# Patient Record
Sex: Female | Born: 1980 | Race: White | Hispanic: No | Marital: Single | Smoking: Never smoker
Health system: Southern US, Community
[De-identification: ages and names within clinical notes are randomized; demographics above are authoritative.]

## PROBLEM LIST (undated history)

## (undated) DIAGNOSIS — F909 Attention-deficit hyperactivity disorder, unspecified type: Secondary | ICD-10-CM

## (undated) DIAGNOSIS — N3010 Interstitial cystitis (chronic) without hematuria: Secondary | ICD-10-CM

## (undated) DIAGNOSIS — N390 Urinary tract infection, site not specified: Secondary | ICD-10-CM

## (undated) DIAGNOSIS — E282 Polycystic ovarian syndrome: Secondary | ICD-10-CM

## (undated) DIAGNOSIS — E785 Hyperlipidemia, unspecified: Secondary | ICD-10-CM

## (undated) DIAGNOSIS — F419 Anxiety disorder, unspecified: Secondary | ICD-10-CM

## (undated) HISTORY — DX: Interstitial cystitis (chronic) without hematuria: N30.10

## (undated) HISTORY — PX: WISDOM TOOTH EXTRACTION: SHX21

## (undated) HISTORY — DX: Hyperlipidemia, unspecified: E78.5

## (undated) HISTORY — DX: Polycystic ovarian syndrome: E28.2

## (undated) HISTORY — DX: Urinary tract infection, site not specified: N39.0

## (undated) HISTORY — DX: Anxiety disorder, unspecified: F41.9

## (undated) HISTORY — DX: Attention-deficit hyperactivity disorder, unspecified type: F90.9

---
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
## 2002-05-26 ENCOUNTER — Other Ambulatory Visit: Admission: RE | Admit: 2002-05-26 | Discharge: 2002-05-26 | Payer: Self-pay | Admitting: Obstetrics and Gynecology

## 2003-07-18 ENCOUNTER — Other Ambulatory Visit: Admission: RE | Admit: 2003-07-18 | Discharge: 2003-07-18 | Payer: Self-pay | Admitting: Obstetrics and Gynecology

## 2004-06-05 ENCOUNTER — Ambulatory Visit: Payer: Self-pay | Admitting: Family Medicine

## 2004-08-02 ENCOUNTER — Ambulatory Visit: Payer: Self-pay | Admitting: Internal Medicine

## 2004-08-04 ENCOUNTER — Emergency Department (HOSPITAL_COMMUNITY): Admission: EM | Admit: 2004-08-04 | Discharge: 2004-08-05 | Payer: Self-pay | Admitting: Emergency Medicine

## 2004-08-06 ENCOUNTER — Other Ambulatory Visit: Admission: RE | Admit: 2004-08-06 | Discharge: 2004-08-06 | Payer: Self-pay | Admitting: Obstetrics and Gynecology

## 2004-08-29 ENCOUNTER — Ambulatory Visit: Payer: Self-pay | Admitting: Pediatrics

## 2005-07-03 ENCOUNTER — Ambulatory Visit: Payer: Self-pay | Admitting: Family Medicine

## 2005-07-18 ENCOUNTER — Ambulatory Visit: Payer: Self-pay | Admitting: Family Medicine

## 2005-07-21 ENCOUNTER — Encounter: Admission: RE | Admit: 2005-07-21 | Discharge: 2005-07-21 | Payer: Self-pay | Admitting: Family Medicine

## 2005-07-22 ENCOUNTER — Ambulatory Visit: Payer: Self-pay | Admitting: Family Medicine

## 2005-08-06 ENCOUNTER — Ambulatory Visit: Payer: Self-pay | Admitting: Family Medicine

## 2005-08-07 ENCOUNTER — Other Ambulatory Visit: Admission: RE | Admit: 2005-08-07 | Discharge: 2005-08-07 | Payer: Self-pay | Admitting: Obstetrics & Gynecology

## 2006-06-11 ENCOUNTER — Ambulatory Visit: Payer: Self-pay | Admitting: Family Medicine

## 2006-07-17 IMAGING — US US RENAL
1 series · 14 of 25 positions shown · non-contrast
Comparison: none

CLINICAL DATA: Two episodes of pyelonephritis.  Persistent right sided pain. 
 RENAL ULTRASOUND:
TECHNIQUE: Complete ultrasound examination of the urinary tract was performed including evaluation of the kidneys, renal collecting systems, and urinary bladder.

[Series 1: unknown · 0.27mm/px · 14 of 36 slices shown]
[im 1/36]
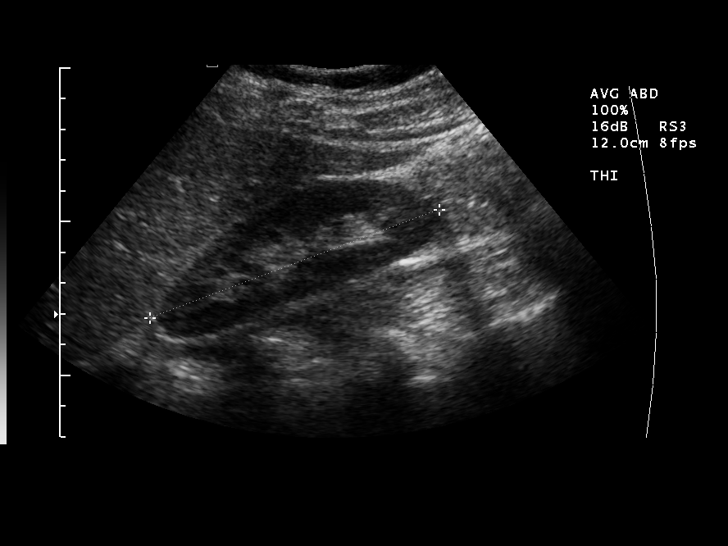
[im 3/36]
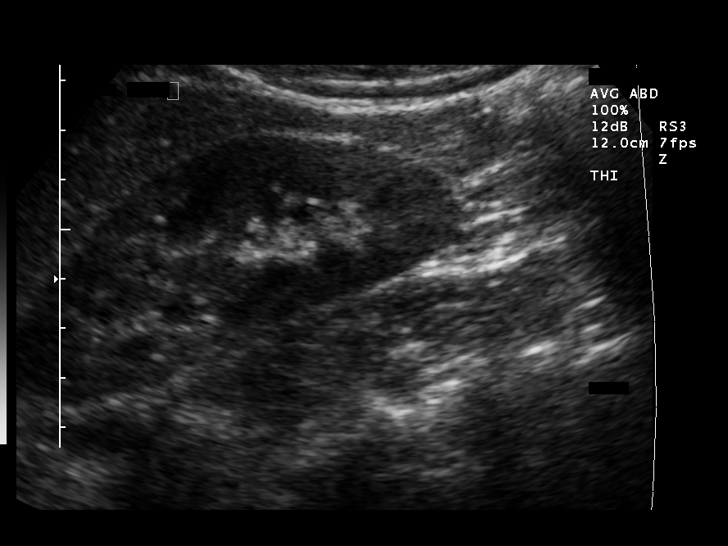
[im 6/36]
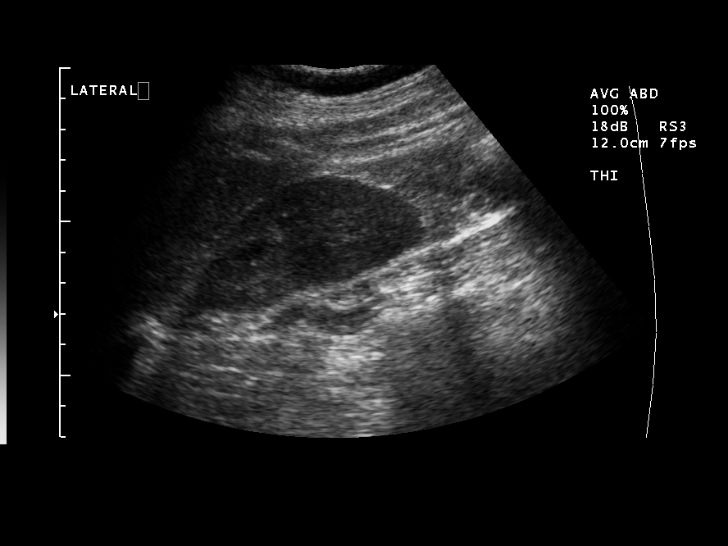
[im 9/36]
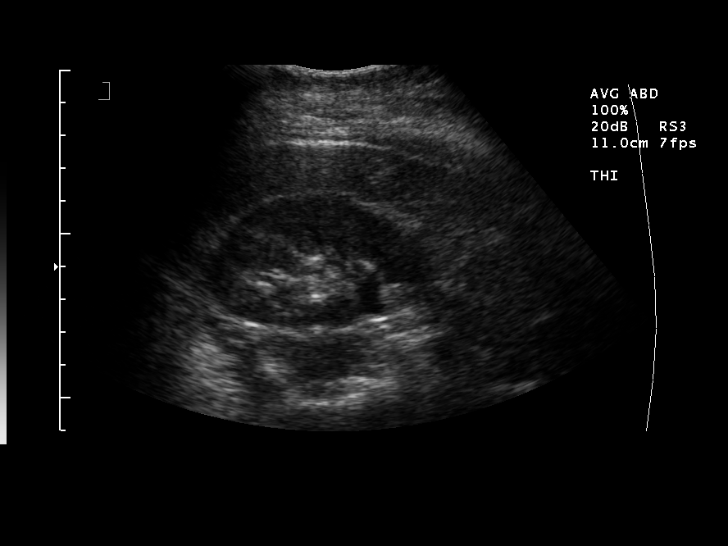
[im 12/36]
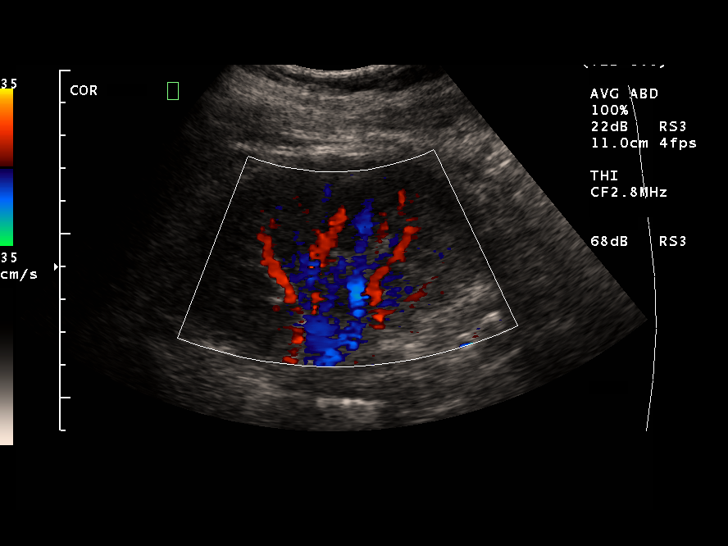
[im 14/36]
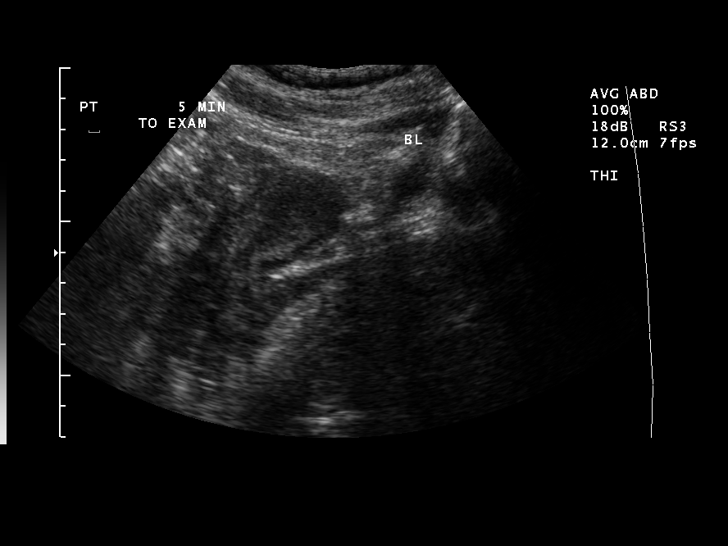
[im 17/36]
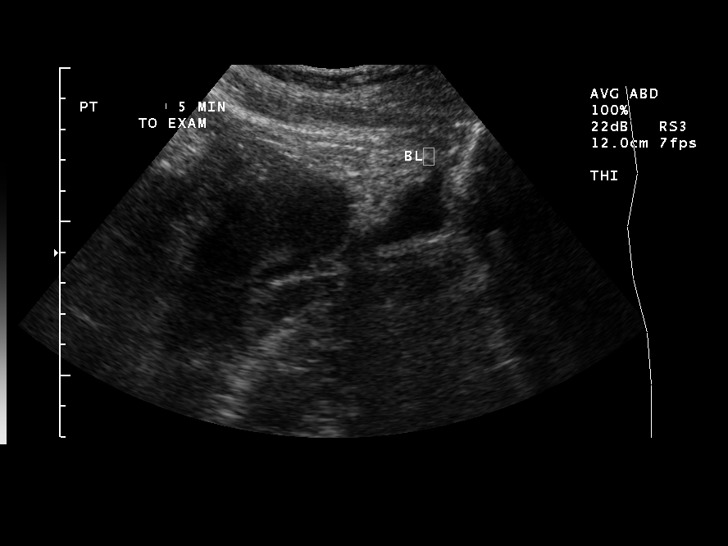
[im 19/36]
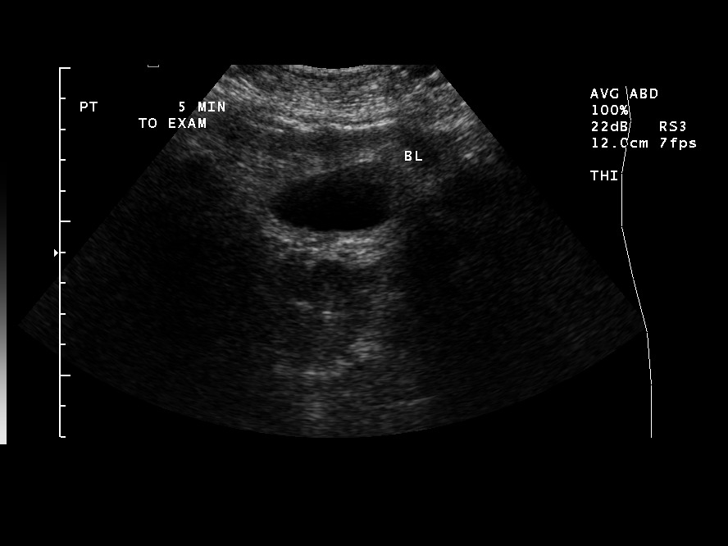
[im 22/36]
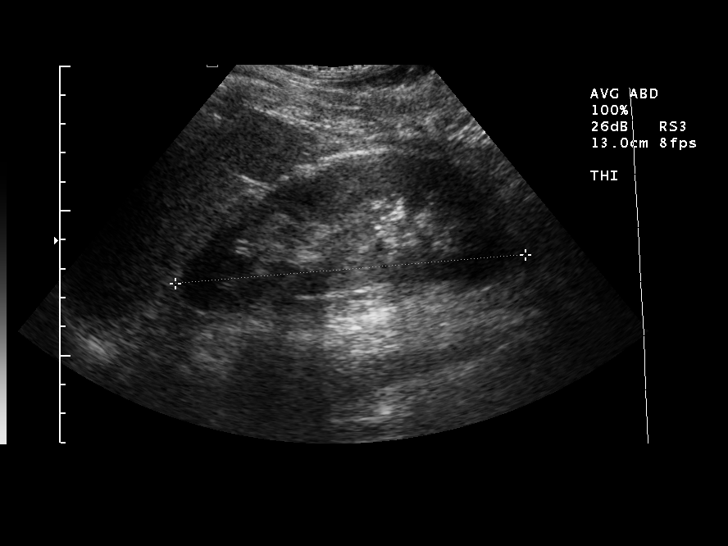
[im 24/36]
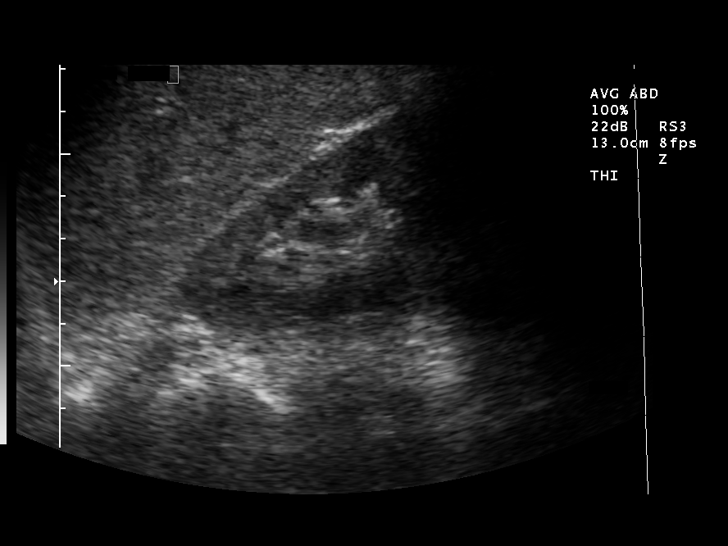
[im 27/36]
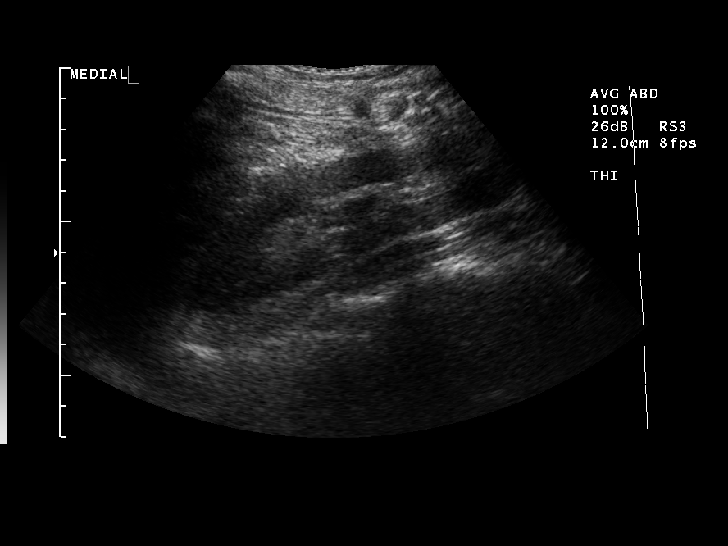
[im 30/36]
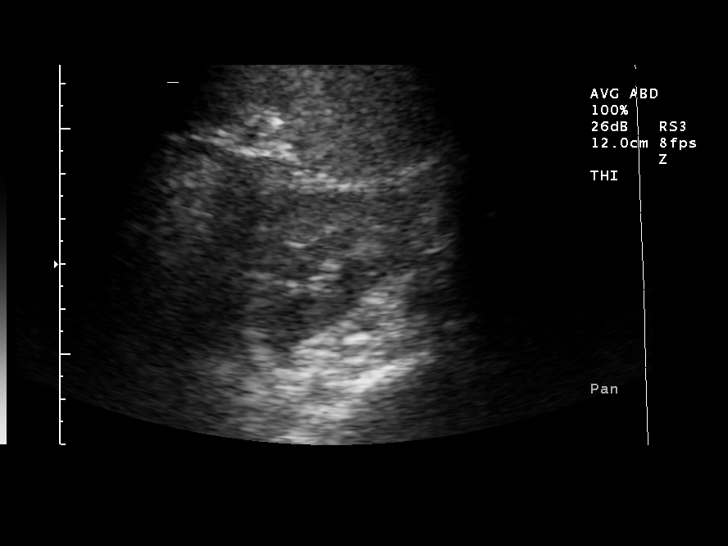
[im 33/36]
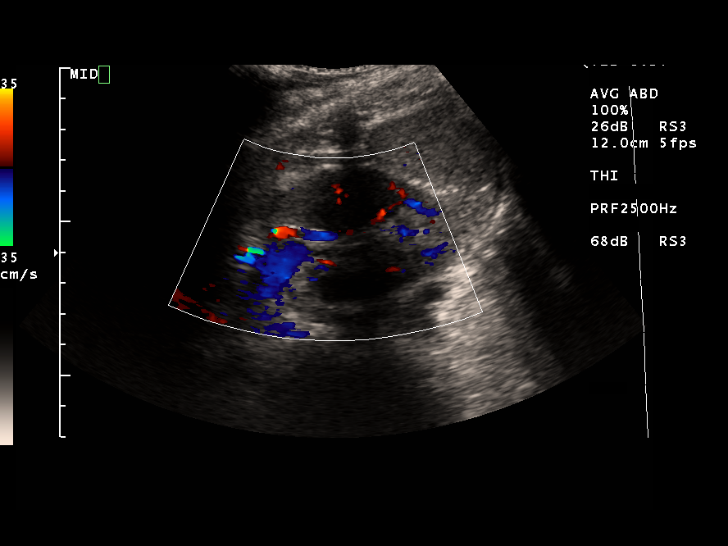
[im 36/36]
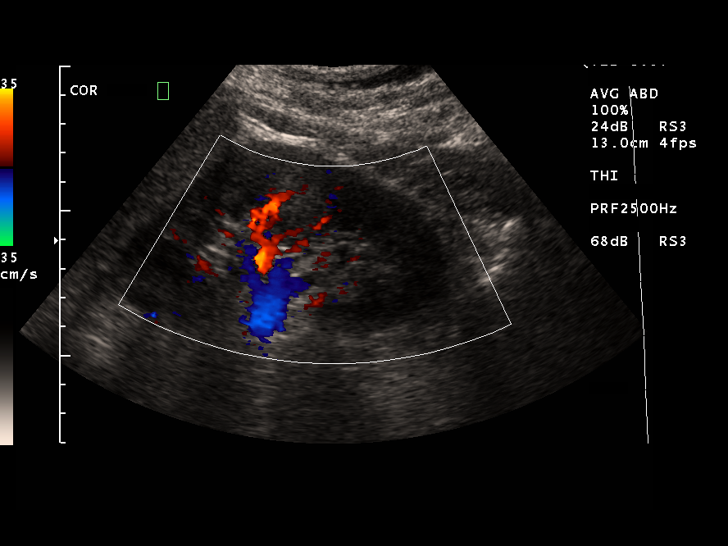

[14 of 25 positions shown; findings below may reference images not displayed]

FINDINGS: The right kidney measures 10.0 cm in length and has a normal contour and no hydronephrosis.  The left kidney measures 12.1 cm in length and also has a normal contour without hydronephrosis. The bladder is nondistended and cannot be visualized.
IMPRESSION: Normal bilateral renal ultrasound.

## 2006-07-29 DIAGNOSIS — F909 Attention-deficit hyperactivity disorder, unspecified type: Secondary | ICD-10-CM | POA: Insufficient documentation

## 2006-07-29 DIAGNOSIS — F902 Attention-deficit hyperactivity disorder, combined type: Secondary | ICD-10-CM | POA: Insufficient documentation

## 2006-08-03 ENCOUNTER — Ambulatory Visit: Payer: Self-pay | Admitting: Family Medicine

## 2006-08-12 ENCOUNTER — Ambulatory Visit: Payer: Self-pay | Admitting: Family Medicine

## 2006-08-12 DIAGNOSIS — N390 Urinary tract infection, site not specified: Secondary | ICD-10-CM | POA: Insufficient documentation

## 2006-08-12 LAB — CONVERTED CEMR LAB
Bilirubin Urine: NEGATIVE
Glucose, Urine, Semiquant: NEGATIVE
Ketones, urine, test strip: NEGATIVE
Nitrite: NEGATIVE
Protein, U semiquant: 30
Urobilinogen, UA: 0.2

## 2006-08-13 ENCOUNTER — Other Ambulatory Visit: Admission: RE | Admit: 2006-08-13 | Discharge: 2006-08-13 | Payer: Self-pay | Admitting: Obstetrics and Gynecology

## 2006-08-26 ENCOUNTER — Ambulatory Visit: Payer: Self-pay | Admitting: Family Medicine

## 2006-08-26 LAB — CONVERTED CEMR LAB
Blood in Urine, dipstick: NEGATIVE
Urobilinogen, UA: 0.2
pH: 6

## 2006-08-27 ENCOUNTER — Encounter: Payer: Self-pay | Admitting: Family Medicine

## 2006-09-01 ENCOUNTER — Encounter: Payer: Self-pay | Admitting: Family Medicine

## 2007-08-24 ENCOUNTER — Other Ambulatory Visit: Admission: RE | Admit: 2007-08-24 | Discharge: 2007-08-24 | Payer: Self-pay | Admitting: Obstetrics and Gynecology

## 2009-02-07 ENCOUNTER — Ambulatory Visit: Payer: Self-pay | Admitting: Family Medicine

## 2009-02-08 ENCOUNTER — Encounter: Payer: Self-pay | Admitting: Family Medicine

## 2009-10-20 ENCOUNTER — Encounter: Payer: Self-pay | Admitting: Family Medicine

## 2009-10-23 ENCOUNTER — Encounter: Payer: Self-pay | Admitting: Family Medicine

## 2010-02-04 ENCOUNTER — Ambulatory Visit
Admission: RE | Admit: 2010-02-04 | Discharge: 2010-02-04 | Payer: Self-pay | Source: Home / Self Care | Attending: Family Medicine | Admitting: Family Medicine

## 2010-02-06 ENCOUNTER — Telehealth: Payer: Self-pay | Admitting: Family Medicine

## 2010-02-12 NOTE — Letter (Signed)
[facility]   Imported By: Maryln Gottron 02/08/2009 15:29:24  _____________________________________________________________________  External Attachment:    Type:   Image     Comment:   External Document

## 2010-02-12 NOTE — Miscellaneous (Signed)
Summary: Flu Vaccine 2011  Clinical Lists Changes  Observations: Added new observation of FLU VAX: Fluvax 3+ (10/20/2009 7:49)      Influenza Immunization History:    Influenza # 1:  Fluvax 3+ (10/20/2009)  Pt was given flu shot at [facility] Lot # [id number] exp 07/13/10  Trixie Dredge  October 23, 2009 7:52 AM

## 2010-02-12 NOTE — Assessment & Plan Note (Signed)
Summary: RITALIN/ADHD/PS   Vital Signs:  Patient profile:   30 year old female Weight:      159 pounds Temp:     98.1 degrees F oral Pulse rate:   102 / minute BP sitting:   112 / 76  (left arm) Cuff size:   regular  Vitals Entered By: Alfred Levins, CMA (February 07, 2009 9:00 AM) CC: get back on ADHD med   History of Present Illness: Here to asks about getting back on ADHD meds. She had been taking either Ritalin LA or Metadate CD for years while in school. Then for the past 2 years she has been off threse meds. She graduated from [employer] and is now working as a Haematologist. The demands of her job have caused some problems, and she would like to get back on meds. Otherwise she feels fine and has had no other medical problems. No UTIs for almost 2 years now.   Preventive Screening-Counseling & Management  Alcohol-Tobacco     Smoking Status: never  Current Medications (verified): 1)  None  Allergies (verified): No Known Drug Allergies  Past History:  Past Medical History: sees Judie Bonus NP for GYN exams ADHD frequent UTIs  Past Surgical History: Reviewed history from 07/29/2006 and no changes required. WISDOM TEETH TUBES IN PLACED  Family History: Reviewed history and no changes required. ADHD in multiple family members kidney cancer Family History Hypertension Family History of Prostate CA 1st degree relative <50  Social History: Reviewed history and no changes required. Single Never Smoked Alcohol use-no Smoking Status:  never  Review of Systems  The patient denies anorexia, fever, weight loss, weight gain, vision loss, decreased hearing, hoarseness, chest pain, syncope, dyspnea on exertion, peripheral edema, prolonged cough, headaches, hemoptysis, abdominal pain, melena, hematochezia, severe indigestion/heartburn, hematuria, incontinence, genital sores, muscle weakness, suspicious skin lesions, transient blindness, difficulty walking, depression, unusual  weight change, abnormal bleeding, enlarged lymph nodes, angioedema, breast masses, and testicular masses.    Physical Exam  General:  Well-developed,well-nourished,in no acute distress; alert,appropriate and cooperative throughout examination Neck:  No deformities, masses, or tenderness noted. Lungs:  Normal respiratory effort, chest expands symmetrically. Lungs are clear to auscultation, no crackles or wheezes. Heart:  Normal rate and regular rhythm. S1 and S2 normal without gallop, murmur, click, rub or other extra sounds. Neurologic:  alert & oriented X3, cranial nerves II-XII intact, and gait normal.   Psych:  Oriented X3, memory intact for recent and remote, normally interactive, and good eye contact.     Impression & Recommendations:  Problem # 1:  ADHD (ICD-314.01)  Complete Medication List: 1)  Depoprovera Shots  .... Once every 3 months 2)  Metadate Cd 20 Mg Cr-caps (Methylphenidate hcl) .... Once daily  Patient Instructions: 1)  We agreed to get back on meds and will try Metadate CD.  2)  Please schedule a follow-up appointment in 1 month.  Prescriptions: METADATE CD 20 MG CR-CAPS (METHYLPHENIDATE HCL) once daily  #30 x 0   Entered and Authorized by:   Nelwyn Salisbury MD   Signed by:   Nelwyn Salisbury MD on 02/07/2009   Method used:   Print then Give to Patient   RxID:   [id number]

## 2010-02-12 NOTE — Medication Information (Signed)
Summary: Authorization for Metadate/[facility]  Authorization for Metadate/[facility]   Imported By: Maryln Gottron 02/12/2009 14:23:40  _____________________________________________________________________  External Attachment:    Type:   Image     Comment:   External Document

## 2010-02-12 NOTE — Miscellaneous (Signed)
Summary: Flu Shot/[facility]  Flu Shot/[facility]   Imported By: Maryln Gottron 10/25/2009 12:22:25  _____________________________________________________________________  External Attachment:    Type:   Image     Comment:   External Document

## 2010-02-14 NOTE — Progress Notes (Signed)
Summary: Call A Nurse   [facility] Triage Call Report Triage Record Num: [id number] Operator: Tomasita Crumble Patient Name: Misty Sanchez Call Date & Time: 02/05/2010 7:01:12PM Patient Phone: [phone] PCP: Tera Mater. Clent Ridges Patient Gender: Female PCP Fax : [phone] Patient DOB: 09-14-1980 Practice Name: [facility] Reason for Call: Lurena Joiner calling. Reports nausea; on Z-Pak and Mucinex D. Took meds this am. LMP " a few years; on depoprovera". Last voided at 1845; denies dizziness. No vomiting and denies abdominal pain. Advised follow up w/ MD in 24 hours, home care measures for the interim. Parameters for callback given. Protocol(s) Used: Nausea or Vomiting Recommended Outcome per Protocol: Call Provider within 24 Hours Reason for Outcome: Symptoms began after starting or changing dose of prescription, nonprescription, alternative medication, or illicit drug Care Advice:  ~ Avoid drinking alcoholic or caffeinated beverages.  ~ Speak with provider before next dose of medication is due. Nausea Care Advice: - Drink small amounts of clear, sweetened liquids or ice cold drinks. - Eat light, bland foods such as saltine crackers or plain bread. - Do not eat high fat, highly seasoned, high fiber, or high sugar content foods. - Avoid mixing hot food and cold foods. - Eat smaller, more frequent meals. - Rest as much as possible in a sitting or in a propped lying position. Do not lie flat for at least 2 hours after eating. - Do not take pain medication (such as aspirin, NSAIDs) while nauseated. - Rest as much as possible until symptoms improve since activity may worsen nausea.  ~ Medication Advice: - Discontinue all nonprescription and alternative medications, especially stimulants, until evaluated by provider. - Take prescribed medications as directed, following label instructions for the medication. - Do not change medications or dosing regimen until provider is consulted. -  Know possible side effects of medication and what to do if they occur. - Tell provider all prescription, nonprescription or alternative medications that you take  ~ 02/05/2010 7:10:50PM Page 1 of 1 [id number]

## 2010-02-14 NOTE — Progress Notes (Signed)
Summary: change antibiotic  Phone Note Call from Patient Call back at Home Phone [phone]   Caller: Patient Call For: Nelwyn Salisbury MD Summary of Call: Pt had reaction to the Zpack, and needs a new antibiotic.  [employer].  Nausea on Zpack. Initial call taken by: Lynann Beaver CMA AAMA,  February 06, 2010 9:24 AM  Follow-up for Phone Call        call in Doxycycline 100 mg two times a day for 10 days  Follow-up by: Nelwyn Salisbury MD,  February 06, 2010 11:57 AM  Additional Follow-up for Phone Call Additional follow up Details #1::        completed pt aware  Additional Follow-up by: Pura Spice, RN,  February 06, 2010 12:36 PM    New/Updated Medications: DOXYCYCLINE HYCLATE 100 MG CAPS (DOXYCYCLINE HYCLATE) Take 1 tab twice a day Prescriptions: DOXYCYCLINE HYCLATE 100 MG CAPS (DOXYCYCLINE HYCLATE) Take 1 tab twice a day  #20 x 0   Entered by:   Pura Spice, RN   Authorized by:   Nelwyn Salisbury MD   Signed by:   Pura Spice, RN on 02/06/2010   Method used:   Electronically to        [facility]  [phone]* (retail)       [address]       [place], [place]  [address]       Ph: [phone] or [phone]       Fax: [phone]   RxID:   [id number]

## 2010-02-14 NOTE — Assessment & Plan Note (Signed)
Summary: COUGH/CONGESTION/[facility]   Vital Signs:  Patient profile:   30 year old female Weight:      164 pounds O2 Sat:      96 % Temp:     98.1 degrees F Pulse rate:   104 / minute BP sitting:   120 / 84  (left arm)  Vitals Entered By: Pura Spice, RN (February 04, 2010 2:26 PM) CC: cough congestion  stuffy nose   History of Present Illness: Here for 2 weeks of sinus pressure, PND, ST, and a dry cough. No fever.   Allergies (verified): No Known Drug Allergies  Past History:  Past Medical History: Reviewed history from 02/07/2009 and no changes required. sees Judie Bonus NP for GYN exams ADHD frequent UTIs  Review of Systems  The patient denies anorexia, fever, weight loss, weight gain, vision loss, decreased hearing, hoarseness, chest pain, syncope, dyspnea on exertion, peripheral edema, hemoptysis, abdominal pain, melena, hematochezia, severe indigestion/heartburn, hematuria, incontinence, genital sores, muscle weakness, suspicious skin lesions, transient blindness, difficulty walking, depression, unusual weight change, abnormal bleeding, enlarged lymph nodes, angioedema, breast masses, and testicular masses.    Physical Exam  General:  Well-developed,well-nourished,in no acute distress; alert,appropriate and cooperative throughout examination Head:  Normocephalic and atraumatic without obvious abnormalities. No apparent alopecia or balding. Eyes:  No corneal or conjunctival inflammation noted. EOMI. Perrla. Funduscopic exam benign, without hemorrhages, exudates or papilledema. Vision grossly normal. Ears:  External ear exam shows no significant lesions or deformities.  Otoscopic examination reveals clear canals, tympanic membranes are intact bilaterally without bulging, retraction, inflammation or discharge. Hearing is grossly normal bilaterally. Nose:  External nasal examination shows no deformity or inflammation. Nasal mucosa are pink and moist without lesions or  exudates. Mouth:  Oral mucosa and oropharynx without lesions or exudates.  Teeth in good repair. Neck:  No deformities, masses, or tenderness noted. Lungs:  Normal respiratory effort, chest expands symmetrically. Lungs are clear to auscultation, no crackles or wheezes.   Impression & Recommendations:  Problem # 1:  ACUTE SINUSITIS, UNSPECIFIED (ICD-461.9)  Her updated medication list for this problem includes:    Zithromax Z-pak 250 Mg Tabs (Azithromycin) .Marland Kitchen... As directed  Complete Medication List: 1)  Depoprovera Shots  .... Once every 3 months 2)  Zithromax Z-pak 250 Mg Tabs (Azithromycin) .... As directed  Patient Instructions: 1)  Please schedule a follow-up appointment as needed .  Prescriptions: ZITHROMAX Z-PAK 250 MG TABS (AZITHROMYCIN) as directed  #1 x 0   Entered and Authorized by:   Nelwyn Salisbury MD   Signed by:   Nelwyn Salisbury MD on 02/04/2010   Method used:   Electronically to        [facility]  [phone]* (retail)       [address]       [place], [place]  [address]       Ph: [phone] or [phone]       Fax: [phone]   RxID:   [id number]    Orders Added: 1)  Est. Patient Level IV [25956]

## 2011-03-31 ENCOUNTER — Ambulatory Visit (INDEPENDENT_AMBULATORY_CARE_PROVIDER_SITE_OTHER): Payer: Managed Care, Other (non HMO) | Admitting: Family Medicine

## 2011-03-31 ENCOUNTER — Encounter: Payer: Self-pay | Admitting: Family Medicine

## 2011-03-31 VITALS — BP 108/68 | HR 82 | Temp 97.5°F | Wt 140.0 lb

## 2011-03-31 DIAGNOSIS — H6980 Other specified disorders of Eustachian tube, unspecified ear: Secondary | ICD-10-CM

## 2011-03-31 NOTE — Progress Notes (Signed)
  Subjective:    Patient ID: Misty Sanchez, female    DOB: 30-Apr-1980, 31 y.o.   MRN: [id number]  HPI Here for 2 weeks of stuffy head and pressure in both ears. She had a ST and cough 2 weeks ago, but these symptoms resolved. No fever. She has Flonase at home but it not using it.    Review of Systems  Constitutional: Negative.   HENT: Positive for ear pain, congestion and sinus pressure. Negative for postnasal drip.   Eyes: Negative.   Respiratory: Negative.        Objective:   Physical Exam  Constitutional: She appears well-developed and well-nourished.  HENT:  Right Ear: External ear normal.  Left Ear: External ear normal.  Nose: Nose normal.  Mouth/Throat: Oropharynx is clear and moist. No oropharyngeal exudate.  Eyes: Conjunctivae are normal.  Pulmonary/Chest: Effort normal and breath sounds normal.  Lymphadenopathy:    She has no cervical adenopathy.          Assessment & Plan:  Start on Flonase daily and add Mucinex D bid until she feels better

## 2011-06-11 ENCOUNTER — Ambulatory Visit (INDEPENDENT_AMBULATORY_CARE_PROVIDER_SITE_OTHER): Payer: Managed Care, Other (non HMO) | Admitting: Family Medicine

## 2011-06-11 ENCOUNTER — Encounter: Payer: Self-pay | Admitting: Family Medicine

## 2011-06-11 VITALS — BP 110/72 | HR 94 | Temp 98.3°F | Wt 137.0 lb

## 2011-06-11 DIAGNOSIS — H6980 Other specified disorders of Eustachian tube, unspecified ear: Secondary | ICD-10-CM

## 2011-06-11 DIAGNOSIS — H6690 Otitis media, unspecified, unspecified ear: Secondary | ICD-10-CM

## 2011-06-11 MED ORDER — FLUTICASONE PROPIONATE 50 MCG/ACT NA SUSP: 2.0000 | Freq: Every day | NASAL | Status: DC

## 2011-06-11 NOTE — Progress Notes (Signed)
  Subjective:    Patient ID: Misty Sanchez, female    DOB: May 10, 1980, 31 y.o.   MRN: [id number]  HPI Here to recheck her ears after being treated for bilateral ear infections. She recently flew to [place], [place] on vacation, and during the flight she developed pain in the ears. It did not go away after they landed, so she went to an Urgent Care there. They put her on 10 days of Augmentin, and she is 9 days into the course. She feels better now but still has some mild pressure in the ears.    Review of Systems  Constitutional: Negative.   HENT: Positive for hearing loss, ear pain, congestion and postnasal drip. Negative for tinnitus and ear discharge.   Eyes: Negative.   Respiratory: Negative.        Objective:   Physical Exam  Constitutional: She appears well-developed and well-nourished.  HENT:  Right Ear: External ear normal.  Left Ear: External ear normal.  Nose: Nose normal.  Mouth/Throat: Oropharynx is clear and moist. No oropharyngeal exudate.  Eyes: Conjunctivae are normal.  Neck: No thyromegaly present.  Pulmonary/Chest: Effort normal and breath sounds normal.  Lymphadenopathy:    She has no cervical adenopathy.          Assessment & Plan:  It appears that the otitis has resolved. She still has eustachian tube dysfunction. Refilled her Flonase to use daily. Add Claritin D daily.

## 2012-06-08 ENCOUNTER — Other Ambulatory Visit: Payer: Self-pay | Admitting: Nurse Practitioner

## 2012-06-14 ENCOUNTER — Ambulatory Visit (INDEPENDENT_AMBULATORY_CARE_PROVIDER_SITE_OTHER): Payer: BC Managed Care – PPO | Admitting: Obstetrics & Gynecology

## 2012-06-14 ENCOUNTER — Other Ambulatory Visit: Payer: Self-pay

## 2012-06-14 ENCOUNTER — Encounter: Payer: Self-pay | Admitting: Obstetrics & Gynecology

## 2012-06-14 VITALS — BP 110/68 | Wt 144.0 lb

## 2012-06-14 DIAGNOSIS — Z3040 Encounter for surveillance of contraceptives, unspecified: Secondary | ICD-10-CM

## 2012-06-14 MED ORDER — MEDROXYPROGESTERONE ACETATE 150 MG/ML IM SUSP
150.0000 mg | Freq: Once | INTRAMUSCULAR | Status: AC
  Administered 2012-06-14: 150 mg via INTRAMUSCULAR

## 2012-06-14 NOTE — Progress Notes (Signed)
Patient tolerated injection well informed to return in 3 months for follow up injection.

## 2012-09-02 ENCOUNTER — Ambulatory Visit (INDEPENDENT_AMBULATORY_CARE_PROVIDER_SITE_OTHER): Payer: BC Managed Care – PPO | Admitting: *Deleted

## 2012-09-02 VITALS — BP 106/68 | HR 62 | Resp 12 | Ht 66.5 in | Wt 141.0 lb

## 2012-09-02 DIAGNOSIS — Z3040 Encounter for surveillance of contraceptives, unspecified: Secondary | ICD-10-CM

## 2012-09-02 MED ORDER — MEDROXYPROGESTERONE ACETATE 150 MG/ML IM SUSP
150.0000 mg | Freq: Once | INTRAMUSCULAR | Status: AC
  Administered 2012-09-02: 150 mg via INTRAMUSCULAR

## 2012-09-02 NOTE — Progress Notes (Signed)
Patient tolerated injection well is aware to return in 3 months for next Depo Provera Shot.

## 2012-11-18 ENCOUNTER — Ambulatory Visit: Payer: BC Managed Care – PPO

## 2012-11-18 ENCOUNTER — Encounter: Payer: Self-pay | Admitting: Certified Nurse Midwife

## 2012-11-18 ENCOUNTER — Ambulatory Visit (INDEPENDENT_AMBULATORY_CARE_PROVIDER_SITE_OTHER): Payer: BC Managed Care – PPO | Admitting: Certified Nurse Midwife

## 2012-11-18 VITALS — BP 98/64 | HR 64 | Resp 16 | Ht 67.0 in | Wt 143.0 lb

## 2012-11-18 DIAGNOSIS — Z0000 Encounter for general adult medical examination without abnormal findings: Secondary | ICD-10-CM

## 2012-11-18 DIAGNOSIS — Z3040 Encounter for surveillance of contraceptives, unspecified: Secondary | ICD-10-CM

## 2012-11-18 DIAGNOSIS — Z01419 Encounter for gynecological examination (general) (routine) without abnormal findings: Secondary | ICD-10-CM

## 2012-11-18 LAB — POCT URINALYSIS DIPSTICK
Bilirubin, UA: NEGATIVE
Blood, UA: NEGATIVE
Glucose, UA: NEGATIVE
Ketones, UA: NEGATIVE
Leukocytes, UA: NEGATIVE
pH, UA: 5

## 2012-11-18 MED ORDER — MEDROXYPROGESTERONE ACETATE 150 MG/ML IM SUSP
150.0000 mg | Freq: Once | INTRAMUSCULAR | Status: AC
  Administered 2012-11-18: 150 mg via INTRAMUSCULAR

## 2012-11-18 NOTE — Patient Instructions (Addendum)
General topics  Next pap or exam is  due in 1 year Take a Women's multivitamin Take 1200 mg. of calcium daily - prefer dietary If any concerns in interim to call back  Breast Self-Awareness Practicing breast self-awareness may pick up problems early, prevent significant medical complications, and possibly save your life. By practicing breast self-awareness, you can become familiar with how your breasts look and feel and if your breasts are changing. This allows you to notice changes early. It can also offer you some reassurance that your breast health is good. One way to learn what is normal for your breasts and whether your breasts are changing is to do a breast self-exam. If you find a lump or something that was not present in the past, it is best to contact your caregiver right away. Other findings that should be evaluated by your caregiver include nipple discharge, especially if it is bloody; skin changes or reddening; areas where the skin seems to be pulled in (retracted); or new lumps and bumps. Breast pain is seldom associated with cancer (malignancy), but should also be evaluated by a caregiver. BREAST SELF-EXAM The best time to examine your breasts is 5 7 days after your menstrual period is over.  Exit[employer] Patient Information 2013 [employer].   Exercise to Stay Healthy Exercise helps you become and stay healthy. EXERCISE IDEAS AND TIPS Choose exercises that:  You enjoy.  Fit into your day. You do not need to exercise really hard to be healthy. You can do exercises at a slow or medium level and stay healthy. You can:  Stretch before and after working out.  Try yoga, Pilates, or tai chi.  Lift weights.  Walk fast, swim, jog, run, climb stairs, bicycle, dance, or rollerskate.  Take aerobic classes. Exercises that burn about 150 calories:  Running 1  miles in 15 minutes.  Playing volleyball for 45 to 60 minutes.  Washing and waxing a car for 45 to 60  minutes.  Playing touch football for 45 minutes.  Walking 1  miles in 35 minutes.  Pushing a stroller 1  miles in 30 minutes.  Playing basketball for 30 minutes.  Raking leaves for 30 minutes.  Bicycling 5 miles in 30 minutes.  Walking 2 miles in 30 minutes.  Dancing for 30 minutes.  Shoveling snow for 15 minutes.  Swimming laps for 20 minutes.  Walking up stairs for 15 minutes.  Bicycling 4 miles in 15 minutes.  Gardening for 30 to 45 minutes.  Jumping rope for 15 minutes.  Washing windows or floors for 45 to 60 minutes. Document Released: 02/01/2010 Document Revised: 03/24/2011 Document Reviewed: 02/01/2010 [employer] Patient Information 2013 [employer].   Other topics ( that may be useful information):    Sexually Transmitted Disease Sexually transmitted disease (STD) refers to any infection that is passed from person to person during sexual activity. This may happen by way of saliva, semen, blood, vaginal mucus, or urine. Common STDs include:  Gonorrhea.  Chlamydia.  Syphilis.  HIV/AIDS.  Genital herpes.  Hepatitis B and C.  Trichomonas.  Human papillomavirus (HPV).  Pubic lice. CAUSES  An STD may be spread by bacteria, virus, or parasite. A person can get an STD by:  Sexual intercourse with an infected person.  Sharing sex toys with an infected person.  Sharing needles with an infected person.  Having intimate contact with the genitals, mouth, or rectal areas of an infected person. SYMPTOMS  Some people may not have any symptoms, but   they can still pass the infection to others. Different STDs have different symptoms. Symptoms include:  Painful or bloody urination.  Pain in the pelvis, abdomen, vagina, anus, throat, or eyes.  Skin rash, itching, irritation, growths, or sores (lesions). These usually occur in the genital or anal area.  Abnormal vaginal discharge.  Penile discharge in men.  Soft, flesh-colored skin growths in the  genital or anal area.  Fever.  Pain or bleeding during sexual intercourse.  Swollen glands in the groin area.  Yellow skin and eyes (jaundice). This is seen with hepatitis. DIAGNOSIS  To make a diagnosis, your caregiver may:  Take a medical history.  Perform a physical exam.  Take a specimen (culture) to be examined.  Examine a sample of discharge under a microscope.  Perform blood test TREATMENT   Chlamydia, gonorrhea, trichomonas, and syphilis can be cured with antibiotic medicine.  Genital herpes, hepatitis, and HIV can be treated, but not cured, with prescribed medicines. The medicines will lessen the symptoms.  Genital warts from HPV can be treated with medicine or by freezing, burning (electrocautery), or surgery. Warts may come back.  HPV is a virus and cannot be cured with medicine or surgery.However, abnormal areas may be followed very closely by your caregiver and may be removed from the cervix, vagina, or vulva through office procedures or surgery. If your diagnosis is confirmed, your recent sexual partners need treatment. This is true even if they are symptom-free or have a negative culture or evaluation. They should not have sex until their caregiver says it is okay. HOME CARE INSTRUCTIONS  All sexual partners should be informed, tested, and treated for all STDs.  Take your antibiotics as directed. Finish them even if you start to feel better.  Only take over-the-counter or prescription medicines for pain, discomfort, or fever as directed by your caregiver.  Rest.  Eat a balanced diet and drink enough fluids to keep your urine clear or pale yellow.  Do not have sex until treatment is completed and you have followed up with your caregiver. STDs should be checked after treatment.  Keep all follow-up appointments, Pap tests, and blood tests as directed by your caregiver.  Only use latex condoms and water-soluble lubricants during sexual activity. Do not use  petroleum jelly or oils.  Avoid alcohol and illegal drugs.  Get vaccinated for HPV and hepatitis. If you have not received these vaccines in the past, talk to your caregiver about whether one or both might be right for you.  Avoid risky sex practices that can break the skin. The only way to avoid getting an STD is to avoid all sexual activity.Latex condoms and dental dams (for oral sex) will help lessen the risk of getting an STD, but will not completely eliminate the risk. SEEK MEDICAL CARE IF:   You have a fever.  You have any new or worsening symptoms. Document Released: 03/22/2002 Document Revised: 03/24/2011 Document Reviewed: 03/29/2010 Exit[employer] Patient Information 2013 [employer].    Domestic Abuse You are being battered or abused if someone close to you hits, pushes, or physically hurts you in any way. You also are being abused if you are forced into activities. You are being sexually abused if you are forced to have sexual contact of any kind. You are being emotionally abused if you are made to feel worthless or if you are constantly threatened. It is important to remember that help is available. No one has the right to abuse you. PREVENTION OF FURTHER   ABUSE  Learn the warning signs of danger. This varies with situations but may include: the use of alcohol, threats, isolation from friends and family, or forced sexual contact. Leave if you feel that violence is going to occur.  If you are attacked or beaten, report it to the police so the abuse is documented. You do not have to press charges. The police can protect you while you or the attackers are leaving. Get the officer's name and badge number and a copy of the report.  Find someone you can trust and tell them what is happening to you: your caregiver, a nurse, clergy member, close friend or family member. Feeling ashamed is natural, but remember that you have done nothing wrong. No one deserves abuse. Document Released:  12/28/1999 Document Revised: 03/24/2011 Document Reviewed: 03/07/2010 [employer] Patient Information 2013 [employer].    How Much is Too Much Alcohol? Drinking too much alcohol can cause injury, accidents, and health problems. These types of problems can include:   Car crashes.  Falls.  Family fighting (domestic violence).  Drowning.  Fights.  Injuries.  Burns.  Damage to certain organs.  Having a baby with birth defects. ONE DRINK CAN BE TOO MUCH WHEN YOU ARE:  Working.  Pregnant or breastfeeding.  Taking medicines. Ask your doctor.  Driving or planning to drive. If you or someone you know has a drinking problem, get help from a doctor.  Document Released: 10/26/2008 Document Revised: 03/24/2011 Document Reviewed: 10/26/2008 Exit[employer] Patient Information 2013 [employer].   Smoking Hazards Smoking cigarettes is extremely bad for your health. Tobacco smoke has over 200 known poisons in it. There are over 60 chemicals in tobacco smoke that cause cancer. Some of the chemicals found in cigarette smoke include:   Cyanide.  Benzene.  Formaldehyde.  Methanol (wood alcohol).  Acetylene (fuel used in welding torches).  Ammonia. Cigarette smoke also contains the poisonous gases nitrogen oxide and carbon monoxide.  Cigarette smokers have an increased risk of many serious medical problems and Smoking causes approximately:  90% of all lung cancer deaths in men.  80% of all lung cancer deaths in women.  90% of deaths from chronic obstructive lung disease. Compared with nonsmokers, smoking increases the risk of:  Coronary heart disease by 2 to 4 times.  Stroke by 2 to 4 times.  Men developing lung cancer by 23 times.  Women developing lung cancer by 13 times.  Dying from chronic obstructive lung diseases by 12 times.  . Smoking is the most preventable cause of death and disease in our society.  WHY IS SMOKING ADDICTIVE?  Nicotine is the chemical  agent in tobacco that is capable of causing addiction or dependence.  When you smoke and inhale, nicotine is absorbed rapidly into the bloodstream through your lungs. Nicotine absorbed through the lungs is capable of creating a powerful addiction. Both inhaled and non-inhaled nicotine may be addictive.  Addiction studies of cigarettes and spit tobacco show that addiction to nicotine occurs mainly during the teen years, when young people begin using tobacco products. WHAT ARE THE BENEFITS OF QUITTING?  There are many health benefits to quitting smoking.   Likelihood of developing cancer and heart disease decreases. Health improvements are seen almost immediately.  Blood pressure, pulse rate, and breathing patterns start returning to normal soon after quitting. QUITTING SMOKING   American Lung Association - 1-800-LUNGUSA  American Cancer Society - 1-800-ACS-2345 Document Released: 02/07/2004 Document Revised: 03/24/2011 Document Reviewed: 10/11/2008 Exit[employer] Patient Information 2013 [employer].   Stress Management Stress is a state of physical or mental tension that often results from changes in your life or normal routine. Some common causes of stress are:  Death of a loved one.  Injuries or severe illnesses.  Getting fired or changing jobs.  Moving into a new home. Other causes may be:  Sexual problems.  Business or financial losses.  Taking on a large debt.  Regular conflict with someone at home or at work.  Constant tiredness from lack of sleep. It is not just bad things that are stressful. It may be stressful to:  Win the lottery.  Get married.  Buy a new car. The amount of stress that can be easily tolerated varies from person to person. Changes generally cause stress, regardless of the types of change. Too much stress can affect your health. It may lead to physical or emotional problems. Too little stress (boredom) may also become stressful. SUGGESTIONS TO  REDUCE STRESS:  Talk things over with your family and friends. It often is helpful to share your concerns and worries. If you feel your problem is serious, you may want to get help from a professional counselor.  Consider your problems one at a time instead of lumping them all together. Trying to take care of everything at once may seem impossible. List all the things you need to do and then start with the most important one. Set a goal to accomplish 2 or 3 things each day. If you expect to do too many in a single day you will naturally fail, causing you to feel even more stressed.  Do not use alcohol or drugs to relieve stress. Although you may feel better for a short time, they do not remove the problems that caused the stress. They can also be habit forming.  Exercise regularly - at least 3 times per week. Physical exercise can help to relieve that "uptight" feeling and will relax you.  The shortest distance between despair and hope is often a good night's sleep.  Go to bed and get up on time allowing yourself time for appointments without being rushed.  Take a short "time-out" period from any stressful situation that occurs during the day. Close your eyes and take some deep breaths. Starting with the muscles in your face, tense them, hold it for a few seconds, then relax. Repeat this with the muscles in your neck, shoulders, hand, stomach, back and legs.  Take good care of yourself. Eat a balanced diet and get plenty of rest.  Schedule time for having fun. Take a break from your daily routine to relax. HOME CARE INSTRUCTIONS   Call if you feel overwhelmed by your problems and feel you can no longer manage them on your own.  Return immediately if you feel like hurting yourself or someone else. Document Released: 06/25/2000 Document Revised: 03/24/2011 Document Reviewed: 02/15/2007 Exit[employer] Patient Information 2013 [employer].       General topics  Next pap or exam is  due in 1  year Take a Women's multivitamin Take 1200 mg. of calcium daily - prefer dietary If any concerns in interim to call back  Breast Self-Awareness Practicing breast self-awareness may pick up problems early, prevent significant medical complications, and possibly save your life. By practicing breast self-awareness, you can become familiar with how your breasts look and feel and if your breasts are changing. This allows you to notice changes early. It can also offer you some reassurance that your breast   health is good. One way to learn what is normal for your breasts and whether your breasts are changing is to do a breast self-exam. If you find a lump or something that was not present in the past, it is best to contact your caregiver right away. Other findings that should be evaluated by your caregiver include nipple discharge, especially if it is bloody; skin changes or reddening; areas where the skin seems to be pulled in (retracted); or new lumps and bumps. Breast pain is seldom associated with cancer (malignancy), but should also be evaluated by a caregiver. BREAST SELF-EXAM The best time to examine your breasts is 5 7 days after your menstrual period is over.  [employer] Patient Information 2013 [employer].   Exercise to Stay Healthy Exercise helps you become and stay healthy. EXERCISE IDEAS AND TIPS Choose exercises that:  You enjoy.  Fit into your day. You do not need to exercise really hard to be healthy. You can do exercises at a slow or medium level and stay healthy. You can:  Stretch before and after working out.  Try yoga, Pilates, or tai chi.  Lift weights.  Walk fast, swim, jog, run, climb stairs, bicycle, dance, or rollerskate.  Take aerobic classes. Exercises that burn about 150 calories:  Running 1  miles in 15 minutes.  Playing volleyball for 45 to 60 minutes.  Washing and waxing a car for 45 to 60 minutes.  Playing touch football for 45 minutes.  Walking 1   miles in 35 minutes.  Pushing a stroller 1  miles in 30 minutes.  Playing basketball for 30 minutes.  Raking leaves for 30 minutes.  Bicycling 5 miles in 30 minutes.  Walking 2 miles in 30 minutes.  Dancing for 30 minutes.  Shoveling snow for 15 minutes.  Swimming laps for 20 minutes.  Walking up stairs for 15 minutes.  Bicycling 4 miles in 15 minutes.  Gardening for 30 to 45 minutes.  Jumping rope for 15 minutes.  Washing windows or floors for 45 to 60 minutes. Document Released: 02/01/2010 Document Revised: 03/24/2011 Document Reviewed: 02/01/2010 Exit[employer] Patient Information 2013 [employer].   Other topics ( that may be useful information):    Sexually Transmitted Disease Sexually transmitted disease (STD) refers to any infection that is passed from person to person during sexual activity. This may happen by way of saliva, semen, blood, vaginal mucus, or urine. Common STDs include:  Gonorrhea.  Chlamydia.  Syphilis.  HIV/AIDS.  Genital herpes.  Hepatitis B and C.  Trichomonas.  Human papillomavirus (HPV).  Pubic lice. CAUSES  An STD may be spread by bacteria, virus, or parasite. A person can get an STD by:  Sexual intercourse with an infected person.  Sharing sex toys with an infected person.  Sharing needles with an infected person.  Having intimate contact with the genitals, mouth, or rectal areas of an infected person. SYMPTOMS  Some people may not have any symptoms, but they can still pass the infection to others. Different STDs have different symptoms. Symptoms include:  Painful or bloody urination.  Pain in the pelvis, abdomen, vagina, anus, throat, or eyes.  Skin rash, itching, irritation, growths, or sores (lesions). These usually occur in the genital or anal area.  Abnormal vaginal discharge.  Penile discharge in men.  Soft, flesh-colored skin growths in the genital or anal area.  Fever.  Pain or bleeding during  sexual intercourse.  Swollen glands in the groin area.  Yellow skin and eyes (jaundice).   This is seen with hepatitis. DIAGNOSIS  To make a diagnosis, your caregiver may:  Take a medical history.  Perform a physical exam.  Take a specimen (culture) to be examined.  Examine a sample of discharge under a microscope.  Perform blood test TREATMENT   Chlamydia, gonorrhea, trichomonas, and syphilis can be cured with antibiotic medicine.  Genital herpes, hepatitis, and HIV can be treated, but not cured, with prescribed medicines. The medicines will lessen the symptoms.  Genital warts from HPV can be treated with medicine or by freezing, burning (electrocautery), or surgery. Warts may come back.  HPV is a virus and cannot be cured with medicine or surgery.However, abnormal areas may be followed very closely by your caregiver and may be removed from the cervix, vagina, or vulva through office procedures or surgery. If your diagnosis is confirmed, your recent sexual partners need treatment. This is true even if they are symptom-free or have a negative culture or evaluation. They should not have sex until their caregiver says it is okay. HOME CARE INSTRUCTIONS  All sexual partners should be informed, tested, and treated for all STDs.  Take your antibiotics as directed. Finish them even if you start to feel better.  Only take over-the-counter or prescription medicines for pain, discomfort, or fever as directed by your caregiver.  Rest.  Eat a balanced diet and drink enough fluids to keep your urine clear or pale yellow.  Do not have sex until treatment is completed and you have followed up with your caregiver. STDs should be checked after treatment.  Keep all follow-up appointments, Pap tests, and blood tests as directed by your caregiver.  Only use latex condoms and water-soluble lubricants during sexual activity. Do not use petroleum jelly or oils.  Avoid alcohol and illegal  drugs.  Get vaccinated for HPV and hepatitis. If you have not received these vaccines in the past, talk to your caregiver about whether one or both might be right for you.  Avoid risky sex practices that can break the skin. The only way to avoid getting an STD is to avoid all sexual activity.Latex condoms and dental dams (for oral sex) will help lessen the risk of getting an STD, but will not completely eliminate the risk. SEEK MEDICAL CARE IF:   You have a fever.  You have any new or worsening symptoms. Document Released: 03/22/2002 Document Revised: 03/24/2011 Document Reviewed: 03/29/2010 Exit[employer] Patient Information 2013 [employer].    Domestic Abuse You are being battered or abused if someone close to you hits, pushes, or physically hurts you in any way. You also are being abused if you are forced into activities. You are being sexually abused if you are forced to have sexual contact of any kind. You are being emotionally abused if you are made to feel worthless or if you are constantly threatened. It is important to remember that help is available. No one has the right to abuse you. PREVENTION OF FURTHER ABUSE  Learn the warning signs of danger. This varies with situations but may include: the use of alcohol, threats, isolation from friends and family, or forced sexual contact. Leave if you feel that violence is going to occur.  If you are attacked or beaten, report it to the police so the abuse is documented. You do not have to press charges. The police can protect you while you or the attackers are leaving. Get the officer's name and badge number and a copy of the report.  Find someone you   can trust and tell them what is happening to you: your caregiver, a nurse, clergy member, close friend or family member. Feeling ashamed is natural, but remember that you have done nothing wrong. No one deserves abuse. Document Released: 12/28/1999 Document Revised: 03/24/2011 Document  Reviewed: 03/07/2010 Exit[employer] Patient Information 2013 [employer].    How Much is Too Much Alcohol? Drinking too much alcohol can cause injury, accidents, and health problems. These types of problems can include:   Car crashes.  Falls.  Family fighting (domestic violence).  Drowning.  Fights.  Injuries.  Burns.  Damage to certain organs.  Having a baby with birth defects. ONE DRINK CAN BE TOO MUCH WHEN YOU ARE:  Working.  Pregnant or breastfeeding.  Taking medicines. Ask your doctor.  Driving or planning to drive. If you or someone you know has a drinking problem, get help from a doctor.  Document Released: 10/26/2008 Document Revised: 03/24/2011 Document Reviewed: 10/26/2008 Exit[employer] Patient Information 2013 [employer].   Smoking Hazards Smoking cigarettes is extremely bad for your health. Tobacco smoke has over 200 known poisons in it. There are over 60 chemicals in tobacco smoke that cause cancer. Some of the chemicals found in cigarette smoke include:   Cyanide.  Benzene.  Formaldehyde.  Methanol (wood alcohol).  Acetylene (fuel used in welding torches).  Ammonia. Cigarette smoke also contains the poisonous gases nitrogen oxide and carbon monoxide.  Cigarette smokers have an increased risk of many serious medical problems and Smoking causes approximately:  90% of all lung cancer deaths in men.  80% of all lung cancer deaths in women.  90% of deaths from chronic obstructive lung disease. Compared with nonsmokers, smoking increases the risk of:  Coronary heart disease by 2 to 4 times.  Stroke by 2 to 4 times.  Men developing lung cancer by 23 times.  Women developing lung cancer by 13 times.  Dying from chronic obstructive lung diseases by 12 times.  . Smoking is the most preventable cause of death and disease in our society.  WHY IS SMOKING ADDICTIVE?  Nicotine is the chemical agent in tobacco that is capable of causing addiction  or dependence.  When you smoke and inhale, nicotine is absorbed rapidly into the bloodstream through your lungs. Nicotine absorbed through the lungs is capable of creating a powerful addiction. Both inhaled and non-inhaled nicotine may be addictive.  Addiction studies of cigarettes and spit tobacco show that addiction to nicotine occurs mainly during the teen years, when young people begin using tobacco products. WHAT ARE THE BENEFITS OF QUITTING?  There are many health benefits to quitting smoking.   Likelihood of developing cancer and heart disease decreases. Health improvements are seen almost immediately.  Blood pressure, pulse rate, and breathing patterns start returning to normal soon after quitting. QUITTING SMOKING   American Lung Association - 1-800-LUNGUSA  American Cancer Society - 1-800-ACS-2345 Document Released: 02/07/2004 Document Revised: 03/24/2011 Document Reviewed: 10/11/2008 Exit[employer] Patient Information 2013 [employer].   Stress Management Stress is a state of physical or mental tension that often results from changes in your life or normal routine. Some common causes of stress are:  Death of a loved one.  Injuries or severe illnesses.  Getting fired or changing jobs.  Moving into a new home. Other causes may be:  Sexual problems.  Business or financial losses.  Taking on a large debt.  Regular conflict with someone at home or at work.  Constant tiredness from lack of sleep. It is not   just bad things that are stressful. It may be stressful to:  Win the lottery.  Get married.  Buy a new car. The amount of stress that can be easily tolerated varies from person to person. Changes generally cause stress, regardless of the types of change. Too much stress can affect your health. It may lead to physical or emotional problems. Too little stress (boredom) may also become stressful. SUGGESTIONS TO REDUCE STRESS:  Talk things over with your family and  friends. It often is helpful to share your concerns and worries. If you feel your problem is serious, you may want to get help from a professional counselor.  Consider your problems one at a time instead of lumping them all together. Trying to take care of everything at once may seem impossible. List all the things you need to do and then start with the most important one. Set a goal to accomplish 2 or 3 things each day. If you expect to do too many in a single day you will naturally fail, causing you to feel even more stressed.  Do not use alcohol or drugs to relieve stress. Although you may feel better for a short time, they do not remove the problems that caused the stress. They can also be habit forming.  Exercise regularly - at least 3 times per week. Physical exercise can help to relieve that "uptight" feeling and will relax you.  The shortest distance between despair and hope is often a good night's sleep.  Go to bed and get up on time allowing yourself time for appointments without being rushed.  Take a short "time-out" period from any stressful situation that occurs during the day. Close your eyes and take some deep breaths. Starting with the muscles in your face, tense them, hold it for a few seconds, then relax. Repeat this with the muscles in your neck, shoulders, hand, stomach, back and legs.  Take good care of yourself. Eat a balanced diet and get plenty of rest.  Schedule time for having fun. Take a break from your daily routine to relax. HOME CARE INSTRUCTIONS   Call if you feel overwhelmed by your problems and feel you can no longer manage them on your own.  Return immediately if you feel like hurting yourself or someone else. Document Released: 06/25/2000 Document Revised: 03/24/2011 Document Reviewed: 02/15/2007 Exit[employer] Patient Information 2013 [employer].   

## 2012-11-18 NOTE — Progress Notes (Signed)
31 y.o. G0P0000 Single Caucasian Fe here for annual exam. Periods none with Depo Provera. Happy with Depo Provera use. Not sexually active now. No STD screening desired. No health issues.  Patient's last menstrual period was 12/15/2011.          Sexually active: no  The current method of family planning is Depo-Provera injections.    Exercising: yes  cardio & weights Smoker:  no  Health Maintenance: Pap:  10-27-11 neg MMG:  none Colonoscopy:  none BMD:   none TDaP:  2008 Labs: Poct urine-neg Self breast exam: not done   reports that she has never smoked. She has never used smokeless tobacco. She reports that she does not drink alcohol or use illicit drugs.  Past Medical History  Diagnosis Date  . ADHD (attention deficit hyperactivity disorder)   . Frequent UTI   . IC (interstitial cystitis)     Past Surgical History  Procedure Laterality Date  . Wisdom tooth extraction    . Tympanostomy tube placement      Current Outpatient Prescriptions  Medication Sig Dispense Refill  . Calcium-Vitamin D-Vitamin K (VIACTIV PO) Take by mouth daily.      Marland Kitchen CRANBERRY PO Take by mouth daily.      . medroxyPROGESTERone (DEPO-PROVERA) 150 MG/ML injection Inject 150 mg into the muscle every 3 (three) months.      . Multiple Vitamins-Minerals (MULTIVITAMIN PO) Take by mouth daily.       No current facility-administered medications for this visit.    Family History  Problem Relation Age of Onset  . Hypertension Mother   . Diabetes Paternal Uncle   . Cancer Maternal Grandmother     gland behind the ear    ROS:  Pertinent items are noted in HPI.  Otherwise, a comprehensive ROS was negative.  Exam:   BP 98/64  Pulse 64  Resp 16  Ht 5\' 7"  (1.702 m)  Wt 143 lb (64.864 kg)  BMI 22.39 kg/m2  LMP 12/15/2011 Height: 5\' 7"  (170.2 cm)  Ht Readings from Last 3 Encounters:  11/18/12 5\' 7"  (1.702 m)  09/02/12 5' 6.5" (1.689 m)    General appearance: alert, cooperative and appears stated  age Head: Normocephalic, without obvious abnormality, atraumatic Neck: no adenopathy, supple, symmetrical, trachea midline and thyroid normal to inspection and palpation Lungs: clear to auscultation bilaterally Breasts: normal appearance, no masses or tenderness, No nipple retraction or dimpling, No nipple discharge or bleeding, No axillary or supraclavicular adenopathy Heart: regular rate and rhythm Abdomen: soft, non-tender; no masses,  no organomegaly Extremities: extremities normal, atraumatic, no cyanosis or edema Skin: Skin color, texture, turgor normal. No rashes or lesions Lymph nodes: Cervical, supraclavicular, and axillary nodes normal. No abnormal inguinal nodes palpated Neurologic: Grossly normal   Pelvic: External genitalia:  no lesions              Urethra:  normal appearing urethra with no masses, tenderness or lesions              Bartholin's and Skene's: normal                 Vagina: normal appearing vagina with normal color and discharge, no lesions              Cervix: normal, nontender              Pap taken: no Bimanual Exam:  Uterus:  normal size, contour, position, consistency, mobility, non-tender and anteverted  Adnexa: normal adnexa and no mass, fullness, tenderness               Rectovaginal: Confirms               Anus:  normal sphincter tone, no lesions  A:  Well Woman with normal exam  Contraception Depo Provera due to day  P:   Reviewed health and wellness pertinent to exam  Discussed importance of maintaining normal weight with Depo Provera use  Rx Depo Provera 150 mg IM every 3 months for one year.  Pap smear as per guidelines   pap smear taken today with HPVHR  counseled on breast self exam, STD prevention, adequate intake of calcium and vitamin D, diet and exercise  return annually or prn  An After Visit Summary was printed and given to the patient.

## 2012-11-21 NOTE — Progress Notes (Signed)
Note reviewed, agree with plan.  Claiborne Stroble, MD  

## 2012-11-22 LAB — IPS PAP TEST WITH HPV

## 2013-02-03 ENCOUNTER — Ambulatory Visit (INDEPENDENT_AMBULATORY_CARE_PROVIDER_SITE_OTHER): Payer: BC Managed Care – PPO | Admitting: *Deleted

## 2013-02-03 VITALS — BP 90/60 | HR 84 | Resp 18 | Wt 142.8 lb

## 2013-02-03 DIAGNOSIS — Z3040 Encounter for surveillance of contraceptives, unspecified: Secondary | ICD-10-CM

## 2013-02-03 MED ORDER — MEDROXYPROGESTERONE ACETATE 150 MG/ML IM SUSP
150.0000 mg | Freq: Once | INTRAMUSCULAR | Status: AC
  Administered 2013-02-03: 150 mg via INTRAMUSCULAR

## 2013-03-13 HISTORY — PX: TYMPANOSTOMY TUBE PLACEMENT: SHX32

## 2013-04-20 ENCOUNTER — Ambulatory Visit (INDEPENDENT_AMBULATORY_CARE_PROVIDER_SITE_OTHER): Payer: BC Managed Care – PPO | Admitting: *Deleted

## 2013-04-20 VITALS — BP 106/70 | Resp 18 | Ht 67.0 in | Wt 144.0 lb

## 2013-04-20 DIAGNOSIS — Z3040 Encounter for surveillance of contraceptives, unspecified: Secondary | ICD-10-CM

## 2013-04-20 MED ORDER — MEDROXYPROGESTERONE ACETATE 150 MG/ML IM SUSP
150.0000 mg | Freq: Once | INTRAMUSCULAR | Status: AC
  Administered 2013-04-20: 150 mg via INTRAMUSCULAR

## 2013-04-20 NOTE — Progress Notes (Signed)
During visit patient expressed concerns of her being on depo and gaining weight which she knows since she's been on the depo for 11 years. She wanted to know if she were to switch to a different form of BC would she gain weight. I told the patient it's hard for us to determine if a patient gains weight it depends on the [facility] and the patient. Patient said she was interested in getting a IUD. S/w Kennon RoundsSally she said it would be best for patient to come back to talk with Ms. Debbie and for the patient to decide if she wanted to get the Depo today or to wait until we could get her in soon so she could talk to Ms. Debbie. Patient decided to get Depo done today and I scheduled her for a consult with Ms. Debbie for 05/16/13.    Patient is within Depo Provera Calender Limits (1 day early) Next Depo Due between: 6/24/-07/21/13 Patient is aware  Pt tolerated Injection well.  Routed to provider for review, encounter closed. [facility],RN

## 2013-04-21 ENCOUNTER — Ambulatory Visit: Payer: BC Managed Care – PPO

## 2013-05-16 ENCOUNTER — Ambulatory Visit (INDEPENDENT_AMBULATORY_CARE_PROVIDER_SITE_OTHER): Payer: BC Managed Care – PPO | Admitting: Certified Nurse Midwife

## 2013-05-16 ENCOUNTER — Encounter: Payer: Self-pay | Admitting: Certified Nurse Midwife

## 2013-05-16 VITALS — BP 106/64 | HR 70 | Resp 16 | Ht 67.0 in | Wt 143.0 lb

## 2013-05-16 DIAGNOSIS — N946 Dysmenorrhea, unspecified: Secondary | ICD-10-CM

## 2013-05-16 NOTE — Progress Notes (Signed)
33 y.o. Single white female presents to discuss change  in contraception.  Previous method used include Depo-Proverawithout any problems for cycle control. Patient previous history of Dysmenorrhea, and menorrhagia with nausea and fainting with menses, which have not been a problem with Depo Provera. Menses is essentially none or scant with wiping only and no cramping. Patient considering IUD and Nexplanon. Patient wants to maintain her cycles as Depo Provera does with essentially amenorrhea.Patient has read about IUD, Nexplanon and feels if she switches she will lose this 5 pounds she has gained with Depo. Weight before Depo Provera 138, now 143. She has been on Depo for approximately 11 year. Patient has never been sexually active with hymen intact and does not tolerate exams very well.  O: healthy female WDWN Affect: normal, orientation x 3    A:  Possible contraception change from Depo Provera to [place] or Nexplanon History of severe dysmenorrhea with menorrhagia  Plan:  Discussion of Advantages and disadvantages of of IUD and Nexplanon, bleeding profile expectations and can not guarantee she will not have menses or problems with. If she does have menses problems with, she can have removal of either device.Questions addressed at length. Patient given pamphlet on both and will need precert if decides and will have to done before Depo Provera due again. Patient declined scheduling, would like to think seriously about. Discussed changing contraception is not a way to change weight. Discussed her weight is appropriate for height.   Rv prn  35 minutes spent with patient with >50% of time spent in face to face counseling.

## 2013-05-17 NOTE — Progress Notes (Signed)
Reviewed personally.  M. Suzanne Myrtis Maille, MD.  

## 2013-07-06 ENCOUNTER — Ambulatory Visit (INDEPENDENT_AMBULATORY_CARE_PROVIDER_SITE_OTHER): Payer: BC Managed Care – PPO | Admitting: *Deleted

## 2013-07-06 VITALS — BP 112/74 | HR 68 | Ht 67.0 in | Wt 146.0 lb

## 2013-07-06 DIAGNOSIS — Z3040 Encounter for surveillance of contraceptives, unspecified: Secondary | ICD-10-CM

## 2013-07-06 MED ORDER — MEDROXYPROGESTERONE ACETATE 150 MG/ML IM SUSP
150.0000 mg | Freq: Once | INTRAMUSCULAR | Status: AC
  Administered 2013-07-06: 150 mg via INTRAMUSCULAR

## 2013-07-06 NOTE — Progress Notes (Signed)
Pt is within the Depo-Provera Calender limit Next Depo Due: Sept 9-Sept 23 Pt aware. Pt tolerated injection well.

## 2013-09-26 ENCOUNTER — Ambulatory Visit (INDEPENDENT_AMBULATORY_CARE_PROVIDER_SITE_OTHER): Payer: BC Managed Care – PPO

## 2013-09-26 VITALS — BP 110/70 | HR 70 | Ht 67.0 in | Wt 144.2 lb

## 2013-09-26 DIAGNOSIS — Z3009 Encounter for other general counseling and advice on contraception: Secondary | ICD-10-CM

## 2013-09-26 DIAGNOSIS — Z3040 Encounter for surveillance of contraceptives, unspecified: Secondary | ICD-10-CM

## 2013-09-26 MED ORDER — MEDROXYPROGESTERONE ACETATE 150 MG/ML IM SUSP
150.0000 mg | Freq: Once | INTRAMUSCULAR | Status: AC
  Administered 2013-09-26: 150 mg via INTRAMUSCULAR

## 2013-12-13 ENCOUNTER — Encounter: Payer: Self-pay | Admitting: Nurse Practitioner

## 2013-12-13 ENCOUNTER — Ambulatory Visit (INDEPENDENT_AMBULATORY_CARE_PROVIDER_SITE_OTHER): Payer: BC Managed Care – PPO | Admitting: Nurse Practitioner

## 2013-12-13 VITALS — BP 110/72 | HR 76 | Ht 66.75 in | Wt 145.0 lb

## 2013-12-13 DIAGNOSIS — Z01419 Encounter for gynecological examination (general) (routine) without abnormal findings: Secondary | ICD-10-CM

## 2013-12-13 DIAGNOSIS — Z3040 Encounter for surveillance of contraceptives, unspecified: Secondary | ICD-10-CM

## 2013-12-13 DIAGNOSIS — Z0000 Encounter for general adult medical examination without abnormal findings: Secondary | ICD-10-CM

## 2013-12-13 LAB — POCT URINALYSIS DIPSTICK
BILIRUBIN UA: NEGATIVE
GLUCOSE UA: NEGATIVE
Ketones, UA: NEGATIVE
NITRITE UA: NEGATIVE
Protein, UA: NEGATIVE
RBC UA: NEGATIVE
UROBILINOGEN UA: NEGATIVE
pH, UA: 8

## 2013-12-13 MED ORDER — MEDROXYPROGESTERONE ACETATE 150 MG/ML IM SUSP
150.0000 mg | Freq: Once | INTRAMUSCULAR | Status: AC
  Administered 2013-12-13: 150 mg via INTRAMUSCULAR

## 2013-12-13 NOTE — Progress Notes (Signed)
Pt arrived for AEX and Depo Provera injection.  Pt tolerated injection well in left gluteal. Last AEX - 12/13/13 Last Depo Provera Given - 09/26/13 Pt is within due dates. Pt should return between 02/28/14 and 03/15/14.

## 2013-12-13 NOTE — Patient Instructions (Signed)

## 2013-12-13 NOTE — Progress Notes (Signed)
Patient ID: Misty SaxonRebecca E Collard, female   DOB: [birth year], 33 y.o.   MRN: [id number] 33 y.o. G0 Single Caucasian Fe here for annual exam.  Not dating. Not ever SA. Had new bilateral myringotomy tubes in March.  She continues to do a good job with calcium and exercise while on Depo.  Not SA ever.  Patient's last menstrual period was 06/13/2013.          Sexually active: no  The current method of family planning is Depo-Provera injections.  Exercising: yes cardio & weights everyday Smoker: no  Health Maintenance: Pap: 11/18/12, negative with neg HR HPV TDaP: 2008 Labs:  HB:  Declined  Urine:  Negative    reports that she has never smoked. She has never used smokeless tobacco. She reports that she does not drink alcohol or use illicit drugs.  Past Medical History  Diagnosis Date  . ADHD (attention deficit hyperactivity disorder)   . Frequent UTI   . IC (interstitial cystitis)   . On Depo-Provera for contraception 08/2002    Past Surgical History  Procedure Laterality Date  . Wisdom tooth extraction    . Tympanostomy tube placement Bilateral 03/2013    Current Outpatient Prescriptions  Medication Sig Dispense Refill  . Acetaminophen (TYLENOL PO) Take by mouth as needed.    . Calcium-Vitamin D-Vitamin K (VIACTIV PO) Take by mouth daily.    Marland Kitchen. CRANBERRY PO Take by mouth daily.    Marland Kitchen. ibuprofen (ADVIL,MOTRIN) 200 MG tablet Take 200 mg by mouth every 6 (six) hours as needed.    . medroxyPROGESTERone (DEPO-PROVERA) 150 MG/ML injection Inject 150 mg into the muscle every 3 (three) months.    . Multiple Vitamins-Minerals (MULTIVITAMIN PO) Take by mouth daily.     No current facility-administered medications for this visit.    Family History  Problem Relation Age of Onset  . Hypertension Mother   . Diabetes Paternal Uncle   . Cancer Maternal Grandmother     gland behind the ear  . Celiac disease Father     ROS:  Pertinent items are noted in HPI.  Otherwise, a comprehensive ROS was  negative.  Exam:   BP 110/72 mmHg  Pulse 76  Ht 5' 6.75" (1.695 m)  Wt 145 lb (65.772 kg)  BMI 22.89 kg/m2  LMP 06/13/2013 Height: 5' 6.75" (169.5 cm)  Ht Readings from Last 3 Encounters:  12/13/13 5' 6.75" (1.695 m)  09/26/13 5\' 7"  (1.702 m)  07/06/13 5\' 7"  (1.702 m)    General appearance: alert, cooperative and appears stated age Head: Normocephalic, without obvious abnormality, atraumatic Neck: no adenopathy, supple, symmetrical, trachea midline and thyroid normal to inspection and palpation Lungs: clear to auscultation bilaterally Breasts: normal appearance, no masses or tenderness Heart: regular rate and rhythm Abdomen: soft, non-tender; no masses,  no organomegaly Extremities: extremities normal, atraumatic, no cyanosis or edema Skin: Skin color, texture, turgor normal. No rashes or lesions Lymph nodes: Cervical, supraclavicular, and axillary nodes normal. No abnormal inguinal nodes palpated Neurologic: Grossly normal   Pelvic: External genitalia:  no lesions              Urethra:  normal appearing urethra with no masses, tenderness or lesions              Bartholin's and Skene's: normal                 Vagina: normal appearing vagina with normal color and discharge, no lesions  Cervix: anteverted              Pap taken: No. Bimanual Exam:  Uterus:  normal size, contour, position, consistency, mobility, non-tender              Adnexa: no mass, fullness, tenderness               Rectovaginal: Confirms               Anus:  normal sphincter tone, no lesions  A:  Well Woman with normal exam   Depo Provera for menorrhagia since 08/2002   P:   Reviewed health and wellness pertinent to exam  Pap smear not taken today  May continue Depo Provera for another year  Counseled on breast self exam, adequate intake of calcium and vitamin D, diet and exercise return annually or prn  An After Visit Summary was printed and given to the patient.

## 2013-12-18 NOTE — Progress Notes (Signed)
Encounter reviewed by Dr. Brook Silva.  

## 2014-03-01 ENCOUNTER — Ambulatory Visit (INDEPENDENT_AMBULATORY_CARE_PROVIDER_SITE_OTHER): Payer: BC Managed Care – PPO | Admitting: *Deleted

## 2014-03-01 VITALS — BP 100/70 | HR 68 | Resp 16 | Ht 66.75 in | Wt 145.0 lb

## 2014-03-01 DIAGNOSIS — Z3040 Encounter for surveillance of contraceptives, unspecified: Secondary | ICD-10-CM

## 2014-03-01 MED ORDER — MEDROXYPROGESTERONE ACETATE 150 MG/ML IM SUSP
150.0000 mg | Freq: Once | INTRAMUSCULAR | Status: AC
  Administered 2014-03-01: 150 mg via INTRAMUSCULAR

## 2014-03-01 NOTE — Progress Notes (Signed)
Patient in today for Depo Provera injection.  Last AEX and Depo 12/13/13 - patient is current today Patient tolerated shot well and advised next depo due between 5/5-5/19.

## 2014-05-18 ENCOUNTER — Ambulatory Visit (INDEPENDENT_AMBULATORY_CARE_PROVIDER_SITE_OTHER): Payer: BC Managed Care – PPO | Admitting: *Deleted

## 2014-05-18 VITALS — BP 110/70 | HR 78 | Resp 16 | Ht 66.75 in | Wt 147.0 lb

## 2014-05-18 DIAGNOSIS — Z3040 Encounter for surveillance of contraceptives, unspecified: Secondary | ICD-10-CM | POA: Diagnosis not present

## 2014-05-18 MED ORDER — MEDROXYPROGESTERONE ACETATE 150 MG/ML IM SUSP
150.0000 mg | Freq: Once | INTRAMUSCULAR | Status: AC
  Administered 2014-05-18: 150 mg via INTRAMUSCULAR

## 2014-05-18 NOTE — Progress Notes (Signed)
Patient in today for Depo Provera Injection.   Last AEX 12/13/13 Last Depo 03/01/14 - Patient is on time today.  Patient tolerated shot well and was advised next depo due between July 21 - August 4th.

## 2014-05-19 ENCOUNTER — Ambulatory Visit: Payer: BC Managed Care – PPO

## 2014-08-03 ENCOUNTER — Ambulatory Visit (INDEPENDENT_AMBULATORY_CARE_PROVIDER_SITE_OTHER): Payer: BC Managed Care – PPO

## 2014-08-03 VITALS — BP 110/78 | HR 72 | Resp 14 | Wt 149.4 lb

## 2014-08-03 DIAGNOSIS — Z3040 Encounter for surveillance of contraceptives, unspecified: Secondary | ICD-10-CM

## 2014-08-03 MED ORDER — MEDROXYPROGESTERONE ACETATE 150 MG/ML IM SUSP
150.0000 mg | Freq: Once | INTRAMUSCULAR | Status: AC
  Administered 2014-08-03: 150 mg via INTRAMUSCULAR

## 2014-08-03 NOTE — Progress Notes (Signed)
Patient is within Depo Provera Calender Limits 7/21-8/4 Next Depo Due between: 10/6-10/20 Last AEX: 12/13/13 with PG AEX Scheduled: No aex schld. Pt was advise that her aex is due in dec and has not been schld yet. Pt was advise to sclhd aex and next depo before leaving. Pt agreed.   Patient is aware when next depo is due.  Pt tolerated Injection well.

## 2014-10-18 ENCOUNTER — Ambulatory Visit (INDEPENDENT_AMBULATORY_CARE_PROVIDER_SITE_OTHER): Payer: BC Managed Care – PPO | Admitting: *Deleted

## 2014-10-18 VITALS — BP 118/70 | HR 60 | Resp 16 | Ht 66.75 in | Wt 152.0 lb

## 2014-10-18 DIAGNOSIS — Z3040 Encounter for surveillance of contraceptives, unspecified: Secondary | ICD-10-CM

## 2014-10-18 MED ORDER — MEDROXYPROGESTERONE ACETATE 150 MG/ML IM SUSP
150.0000 mg | Freq: Once | INTRAMUSCULAR | Status: AC
  Administered 2014-10-18: 150 mg via INTRAMUSCULAR

## 2014-10-18 NOTE — Progress Notes (Signed)
Patient is here for Depo Provera Injection Patient is within Depo Provera Calender Limits pt on time today Next Depo Due between: 12/21-1/4 Last AEX: 12/13/13 PG AEX Scheduled: will schedule   Patient is aware when next depo is due  Pt tolerated Injection well. encounter closed.  Advised pt she needs AEX done before next depo. Pt verbalized understanding.

## 2015-01-09 ENCOUNTER — Ambulatory Visit (INDEPENDENT_AMBULATORY_CARE_PROVIDER_SITE_OTHER): Payer: BC Managed Care – PPO | Admitting: Nurse Practitioner

## 2015-01-09 ENCOUNTER — Encounter: Payer: Self-pay | Admitting: Nurse Practitioner

## 2015-01-09 VITALS — BP 110/60 | HR 84 | Resp 12 | Ht 66.75 in | Wt 155.0 lb

## 2015-01-09 DIAGNOSIS — Z3009 Encounter for other general counseling and advice on contraception: Secondary | ICD-10-CM | POA: Diagnosis not present

## 2015-01-09 DIAGNOSIS — Z3042 Encounter for surveillance of injectable contraceptive: Secondary | ICD-10-CM

## 2015-01-09 DIAGNOSIS — Z0000 Encounter for general adult medical examination without abnormal findings: Secondary | ICD-10-CM

## 2015-01-09 DIAGNOSIS — Z01419 Encounter for gynecological examination (general) (routine) without abnormal findings: Secondary | ICD-10-CM | POA: Diagnosis not present

## 2015-01-09 LAB — CBC WITH DIFFERENTIAL/PLATELET
BASOS ABS: 0.1 10*3/uL (ref 0.0–0.1)
Basophils Relative: 1 % (ref 0–1)
Eosinophils Absolute: 0.2 10*3/uL (ref 0.0–0.7)
Eosinophils Relative: 2 % (ref 0–5)
HEMATOCRIT: 40.4 % (ref 36.0–46.0)
Hemoglobin: 14.2 g/dL (ref 12.0–15.0)
LYMPHS ABS: 2.2 10*3/uL (ref 0.7–4.0)
LYMPHS PCT: 28 % (ref 12–46)
MCH: 30.3 pg (ref 26.0–34.0)
MCHC: 35.1 g/dL (ref 30.0–36.0)
MCV: 86.3 fL (ref 78.0–100.0)
MPV: 10.2 fL (ref 8.6–12.4)
Monocytes Absolute: 0.6 10*3/uL (ref 0.1–1.0)
Monocytes Relative: 8 % (ref 3–12)
NEUTROS ABS: 4.7 10*3/uL (ref 1.7–7.7)
NEUTROS PCT: 61 % (ref 43–77)
Platelets: 313 10*3/uL (ref 150–400)
RBC: 4.68 MIL/uL (ref 3.87–5.11)
RDW: 13.3 % (ref 11.5–15.5)
WBC: 7.7 10*3/uL (ref 4.0–10.5)

## 2015-01-09 LAB — LIPID PANEL
CHOLESTEROL: 207 mg/dL — AB (ref 125–200)
HDL: 54 mg/dL (ref >=46)
LDL CALC: 127 mg/dL (ref <130)
TRIGLYCERIDES: 130 mg/dL (ref <150)
Total CHOL/HDL Ratio: 3.8 Ratio (ref <=5.0)
VLDL: 26 mg/dL (ref <30)

## 2015-01-09 LAB — COMPREHENSIVE METABOLIC PANEL
ALT: 18 U/L (ref 6–29)
AST: 19 U/L (ref 10–30)
Albumin: 4.5 g/dL (ref 3.6–5.1)
Alkaline Phosphatase: 50 U/L (ref 33–115)
BUN: 15 mg/dL (ref 7–25)
CALCIUM: 9.9 mg/dL (ref 8.6–10.2)
CHLORIDE: 102 mmol/L (ref 98–110)
CO2: 25 mmol/L (ref 20–31)
Creat: 0.74 mg/dL (ref 0.50–1.10)
GLUCOSE: 65 mg/dL (ref 65–99)
POTASSIUM: 4.1 mmol/L (ref 3.5–5.3)
Sodium: 137 mmol/L (ref 135–146)
Total Bilirubin: 0.5 mg/dL (ref 0.2–1.2)
Total Protein: 7 g/dL (ref 6.1–8.1)

## 2015-01-09 LAB — TSH: TSH: 0.914 u[IU]/mL (ref 0.350–4.500)

## 2015-01-09 MED ORDER — MEDROXYPROGESTERONE ACETATE 150 MG/ML IM SUSP
150.0000 mg | Freq: Once | INTRAMUSCULAR | Status: AC
  Administered 2015-01-09: 150 mg via INTRAMUSCULAR

## 2015-01-09 NOTE — Progress Notes (Signed)
Patient was here for annual exam and was also due for Depo provera shot.   Her last shot was on 10-18-14. She is due for her next shot between 03/14-03/28 and patient was advised.  The injection was administered in the left buttock. The patient tolerated the shot well.  She denied any reactions to previous injections.

## 2015-01-09 NOTE — Patient Instructions (Signed)

## 2015-01-09 NOTE — Progress Notes (Signed)
34 y.o. G0P0 Single  Caucasian Fe here for annual exam.  Still on Depo Provera.  Does well with calcium chews and dietary calcium.  Not dating.  Never SA. No UTI since last year.  Past history of IC that has been calm this past year.  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  Now working at [employer].  Patient's last menstrual period was 06/13/2013.          Sexually active: No. never SA The current method of family planning is Depo-Provera injections.    Exercising: Yes.    Cardio, weights, bike Smoker:  no  Health Maintenance: Pap:  11/18/12 Neg. HR HPV:neg TDaP:  06/2006 Hep C and HIV: does not need Labs: will do here today   reports that she has never smoked. She has never used smokeless tobacco. She reports that she does not drink alcohol or use illicit drugs.  Past Medical History  Diagnosis Date  . ADHD (attention deficit hyperactivity disorder)   . Frequent UTI   . IC (interstitial cystitis)   . On Depo-Provera for contraception 08/2002    Past Surgical History  Procedure Laterality Date  . Wisdom tooth extraction    . Tympanostomy tube placement Bilateral 03/2013    Current Outpatient Prescriptions  Medication Sig Dispense Refill  . Calcium-Vitamin D-Vitamin K (VIACTIV PO) Take by mouth daily.    Marland Kitchen CRANBERRY PO Take by mouth daily.    . medroxyPROGESTERone (DEPO-PROVERA) 150 MG/ML injection Inject 150 mg into the muscle every 3 (three) months.     No current facility-administered medications for this visit.    Family History  Problem Relation Age of Onset  . Hypertension Mother   . Diabetes Paternal Uncle   . Cancer Maternal Grandmother     gland behind the ear  . Celiac disease Father     ROS:  Pertinent items are noted in HPI.  Otherwise, a comprehensive ROS was negative.  Exam:   BP 110/60 mmHg  Pulse 84  Resp 12  Ht 5' 6.75" (1.695 m)  Wt 155 lb (70.308 kg)  BMI 24.47 kg/m2  LMP 06/13/2013 Height: 5'  6.75" (169.5 cm) Ht Readings from Last 3 Encounters:  01/09/15 5' 6.75" (1.695 m)  10/18/14 5' 6.75" (1.695 m)  05/18/14 5' 6.75" (1.695 m)    General appearance: alert, cooperative and appears stated age Head: Normocephalic, without obvious abnormality, atraumatic Neck: no adenopathy, supple, symmetrical, trachea midline and thyroid normal to inspection and palpation Lungs: clear to auscultation bilaterally Breasts: normal appearance, no masses or tenderness Heart: regular rate and rhythm Abdomen: soft, non-tender; no masses,  no organomegaly Extremities: extremities normal, atraumatic, no cyanosis or edema Skin: Skin color, texture, turgor normal. No rashes or lesions Lymph nodes: Cervical, supraclavicular, and axillary nodes normal. No abnormal inguinal nodes palpated Neurologic: Grossly normal   Pelvic: External genitalia:  no lesions              Urethra:  normal appearing urethra with no masses, tenderness or lesions              Bartholin's and Skene's: normal                 Vagina: normal appearing vagina with normal color and discharge, no lesions limited view due to small introitus              Cervix: anteverted  Pap taken: No. Bimanual Exam:  Uterus:  normal size, contour, position, consistency, mobility, non-tender, limited exam due to small introitus              Adnexa: no mass, fullness, tenderness               Rectovaginal: Confirms               Anus:  normal sphincter tone, no lesions  Chaperone present: no  A:  Well Woman with normal exam  Depo Provera for menorrhagia since 08/2004  Never SA  History of IC    P:   Reviewed health and wellness pertinent to exam  Pap smear as above  She may continue with Depo Provera and will get this injection today.  Will inform her if we need to get a BMD - ACOG information is being reviewed with Dr. Oscar LaJertson.  Counseled on breast self exam, STD prevention, HIV risk factors and prevention, adequate intake  of calcium and vitamin D, diet and exercise return annually or prn  An After Visit Summary was printed and given to the patient.

## 2015-01-10 LAB — VITAMIN D 25 HYDROXY (VIT D DEFICIENCY, FRACTURES): Vit D, 25-Hydroxy: 38 ng/mL (ref 30–100)

## 2015-01-11 NOTE — Progress Notes (Signed)
Encounter reviewed. Reviewed with Ms Berneice GandyGrubb that the patient doesn't need a DEXA. She should be good about getting calcium, vit D and regular exercise. Gertie ExonJill Jertson, MD

## 2015-01-12 ENCOUNTER — Telehealth: Payer: Self-pay | Admitting: *Deleted

## 2015-01-12 NOTE — Telephone Encounter (Signed)
Advised pt OK to continue on Depo Provera and patient does not need bone density at this time. Pt voices understanding and is agreeable with this plan.

## 2015-01-12 NOTE — Telephone Encounter (Signed)
-----   Message from Ria CommentPatricia Grubb, FNP sent at 01/10/2015 11:17 AM EST ----- Please call patient and let her know that she is OK on Depo Provera without concerns of bone loss.

## 2015-03-27 ENCOUNTER — Ambulatory Visit (INDEPENDENT_AMBULATORY_CARE_PROVIDER_SITE_OTHER): Payer: BC Managed Care – PPO

## 2015-03-27 VITALS — BP 112/68 | HR 76 | Resp 12 | Ht 66.75 in | Wt 156.0 lb

## 2015-03-27 DIAGNOSIS — Z3042 Encounter for surveillance of injectable contraceptive: Secondary | ICD-10-CM

## 2015-03-27 DIAGNOSIS — Z309 Encounter for contraceptive management, unspecified: Secondary | ICD-10-CM

## 2015-03-27 MED ORDER — MEDROXYPROGESTERONE ACETATE 150 MG/ML IM SUSP
150.0000 mg | Freq: Once | INTRAMUSCULAR | Status: AC
  Administered 2015-03-27: 150 mg via INTRAMUSCULAR

## 2015-03-27 NOTE — Progress Notes (Signed)
Patient is here for Depo Provera Injection Patient is within Depo Provera Calender Limits  Next Depo Due between: May 30-June13 Last AEX: 01-09-15 AEX Scheduled: Not scheduled yet  Patient is aware when next depo is due  Pt tolerated Injection well in her right ventrogluteal muscle.   Routed to provider for review, encounter closed.

## 2015-06-12 ENCOUNTER — Ambulatory Visit (INDEPENDENT_AMBULATORY_CARE_PROVIDER_SITE_OTHER): Payer: BC Managed Care – PPO | Admitting: *Deleted

## 2015-06-12 VITALS — BP 100/80 | HR 76 | Resp 16 | Ht 66.75 in | Wt 157.0 lb

## 2015-06-12 DIAGNOSIS — Z3042 Encounter for surveillance of injectable contraceptive: Secondary | ICD-10-CM | POA: Diagnosis not present

## 2015-06-12 MED ORDER — MEDROXYPROGESTERONE ACETATE 150 MG/ML IM SUSP
150.0000 mg | Freq: Once | INTRAMUSCULAR | Status: AC
  Administered 2015-06-12: 150 mg via INTRAMUSCULAR

## 2015-06-12 NOTE — Progress Notes (Signed)
Patient is here for Depo Provera Injection Patient is within Depo Provera Calender Limits yes, last depo 03/27/15  Next Depo Due between: 8/15-8/29 Last AEX: 01/09/15 PG AEX Scheduled: Not scheduled   Patient is aware when next depo is due  Pt tolerated Injection well.  Routed to provider for review, encounter closed.

## 2015-06-14 ENCOUNTER — Ambulatory Visit: Payer: BC Managed Care – PPO

## 2015-09-06 ENCOUNTER — Ambulatory Visit (INDEPENDENT_AMBULATORY_CARE_PROVIDER_SITE_OTHER): Payer: BC Managed Care – PPO

## 2015-09-06 VITALS — BP 112/70 | HR 72 | Ht 67.0 in | Wt 153.8 lb

## 2015-09-06 DIAGNOSIS — Z3042 Encounter for surveillance of injectable contraceptive: Secondary | ICD-10-CM | POA: Diagnosis not present

## 2015-09-06 MED ORDER — MEDROXYPROGESTERONE ACETATE 150 MG/ML IM SUSP
150.0000 mg | Freq: Once | INTRAMUSCULAR | Status: AC
  Administered 2015-09-06: 150 mg via INTRAMUSCULAR

## 2015-09-06 NOTE — Progress Notes (Signed)
Patient is here for Depo Provera Injection Patient is within Depo Provera Calender Limits 8/15 - 8/29 Next Depo Due between: 11/9 - 11/23 Last AEX: 01/09/15 PG AEX Scheduled: none scheduled  Patient is aware when next depo is due  Pt tolerated Injection well RUOQ.  Routed to provider for review, encounter closed.

## 2015-11-02 ENCOUNTER — Ambulatory Visit (INDEPENDENT_AMBULATORY_CARE_PROVIDER_SITE_OTHER): Payer: BC Managed Care – PPO | Admitting: Family Medicine

## 2015-11-02 ENCOUNTER — Encounter: Payer: Self-pay | Admitting: Family Medicine

## 2015-11-02 VITALS — BP 108/66 | HR 101 | Temp 98.6°F | Ht 67.0 in | Wt 159.0 lb

## 2015-11-02 DIAGNOSIS — R635 Abnormal weight gain: Secondary | ICD-10-CM | POA: Diagnosis not present

## 2015-11-02 DIAGNOSIS — R5382 Chronic fatigue, unspecified: Secondary | ICD-10-CM

## 2015-11-02 LAB — BASIC METABOLIC PANEL
BUN: 15 mg/dL (ref 6–23)
CHLORIDE: 106 meq/L (ref 96–112)
CO2: 30 mEq/L (ref 19–32)
CREATININE: 0.83 mg/dL (ref 0.40–1.20)
Calcium: 9.6 mg/dL (ref 8.4–10.5)
GFR: 84.1 mL/min (ref >60.00)
GLUCOSE: 74 mg/dL (ref 70–99)
Potassium: 4.3 mEq/L (ref 3.5–5.1)
Sodium: 143 mEq/L (ref 135–145)

## 2015-11-02 LAB — POC URINALSYSI DIPSTICK (AUTOMATED)
Bilirubin, UA: NEGATIVE
Glucose, UA: NEGATIVE
KETONES UA: NEGATIVE
Nitrite, UA: NEGATIVE
PH UA: 6
PROTEIN UA: NEGATIVE
RBC UA: NEGATIVE
SPEC GRAV UA: 1.025
Urobilinogen, UA: 0.2

## 2015-11-02 LAB — CBC WITH DIFFERENTIAL/PLATELET
BASOS ABS: 0.1 10*3/uL (ref 0.0–0.1)
Basophils Relative: 0.7 % (ref 0.0–3.0)
EOS ABS: 0.3 10*3/uL (ref 0.0–0.7)
Eosinophils Relative: 3.3 % (ref 0.0–5.0)
HCT: 39.8 % (ref 36.0–46.0)
Hemoglobin: 13.6 g/dL (ref 12.0–15.0)
LYMPHS ABS: 1.8 10*3/uL (ref 0.7–4.0)
Lymphocytes Relative: 20.6 % (ref 12.0–46.0)
MCHC: 34.2 g/dL (ref 30.0–36.0)
MCV: 86.7 fl (ref 78.0–100.0)
MONO ABS: 0.5 10*3/uL (ref 0.1–1.0)
MONOS PCT: 6 % (ref 3.0–12.0)
NEUTROS PCT: 69.4 % (ref 43.0–77.0)
Neutro Abs: 6 10*3/uL (ref 1.4–7.7)
PLATELETS: 318 10*3/uL (ref 150.0–400.0)
RBC: 4.59 Mil/uL (ref 3.87–5.11)
RDW: 13.2 % (ref 11.5–15.5)
WBC: 8.7 10*3/uL (ref 4.0–10.5)

## 2015-11-02 LAB — HEPATIC FUNCTION PANEL
ALBUMIN: 4.9 g/dL (ref 3.5–5.2)
ALT: 17 U/L (ref 0–35)
AST: 18 U/L (ref 0–37)
Alkaline Phosphatase: 56 U/L (ref 39–117)
BILIRUBIN TOTAL: 0.6 mg/dL (ref 0.2–1.2)
Bilirubin, Direct: 0.1 mg/dL (ref 0.0–0.3)
Total Protein: 7.1 g/dL (ref 6.0–8.3)

## 2015-11-02 LAB — T3, FREE: T3 FREE: 3.6 pg/mL (ref 2.3–4.2)

## 2015-11-02 LAB — VITAMIN B12: VITAMIN B 12: 531 pg/mL (ref 211–911)

## 2015-11-02 LAB — TSH: TSH: 0.6 u[IU]/mL (ref 0.35–4.50)

## 2015-11-02 LAB — T4, FREE: FREE T4: 0.83 ng/dL (ref 0.60–1.60)

## 2015-11-02 NOTE — Progress Notes (Signed)
   Subjective:    Patient ID: Misty Sanchez, female    DOB: Jan 31, 1980, 35 y.o.   MRN: [id number]  HPI Here to re-establish after an absence of 4 and 1/2 years and to discuss weight gain. Over the past year she has been exercising daily either by taking Zumba classes or by working with a Systems analystpersonal trainer at her gym. She watches her diet closely. Despite all this she has been unable to lose any weight and in fact slowly seems to be gaining weight. She stays tired all the time and often fights against falling asleep during the daytime.    Review of Systems  Constitutional: Positive for fatigue. Negative for fever.  Respiratory: Negative.   Cardiovascular: Negative.   Gastrointestinal: Negative.   Genitourinary: Negative.   Neurological: Negative.   Psychiatric/Behavioral: Negative.        Objective:   Physical Exam  Constitutional: She is oriented to person, place, and time. She appears well-developed and well-nourished.  Neck: No thyromegaly present.  Cardiovascular: Normal rate, regular rhythm, normal heart sounds and intact distal pulses.   Pulmonary/Chest: Effort normal and breath sounds normal.  Lymphadenopathy:    She has no cervical adenopathy.  Neurological: She is alert and oriented to person, place, and time.  Psychiatric: She has a normal mood and affect. Her behavior is normal. Thought content normal.          Assessment & Plan:  Fatigue and weight gain. We will send her for labs to check her thyroid status, etc.  Nelwyn SalisburyFRY,Kollins Fenter A, MD

## 2015-11-02 NOTE — Progress Notes (Signed)
Pre visit review using our clinic review tool, if applicable. No additional management support is needed unless otherwise documented below in the visit note. 

## 2015-11-07 ENCOUNTER — Telehealth: Payer: Self-pay | Admitting: Family Medicine

## 2015-11-07 ENCOUNTER — Encounter: Payer: Self-pay | Admitting: Family Medicine

## 2015-11-07 DIAGNOSIS — R635 Abnormal weight gain: Secondary | ICD-10-CM

## 2015-11-07 NOTE — Telephone Encounter (Signed)
This is a duplicate note, see previous my chart message.  

## 2015-11-07 NOTE — Telephone Encounter (Signed)
Pt result are on mychart and she does not understand the results. Please call pt

## 2015-11-07 NOTE — Telephone Encounter (Signed)
Pt would like for you to call and speak with her concerning her lab results the email did not clarify what she was asking.

## 2015-11-08 ENCOUNTER — Telehealth: Payer: Self-pay | Admitting: Nurse Practitioner

## 2015-11-08 NOTE — Telephone Encounter (Signed)
Patient returning your call she would like you to call her on her work number [phone]

## 2015-11-08 NOTE — Telephone Encounter (Signed)
Spoke with patient. Patient states that she had lab work performed with her PCP due it increased fatigued. Reports all results returned normal (lab results in EPIC). States she is still very concerned about fatigue she is having and feels she is not getting answers. Would like to be seen by our office to discuss and have additional hormone testing or any additional lab work. Requesting to be seen the week of 11/13. Aware Misty CommentPatricia Grubb, FNP is out of the office that week. Requesting to see Misty Sanchez CNM as she has seen her before. Appointment scheduled for 11/28/2015 at 10 am with Misty Sanchez CNM. Patient is agreeable to date and time.  Routing to provider for final review. Patient agreeable to disposition. Will close encounter.

## 2015-11-08 NOTE — Telephone Encounter (Signed)
Since all her lab results have ruled out common things we look for like diabetes, thyroid problems, anemia, etc. I would suggest she see an Endocrinologist for more specialized testing. I would be happy to put in a referral for this if she wishes

## 2015-11-08 NOTE — Telephone Encounter (Signed)
Left message to call Eviana Sibilia at [phone]. 

## 2015-11-08 NOTE — Telephone Encounter (Signed)
Patient recently had labs with Dr. Claris CheFry's office.  Patient is having unexplained weight gain.  Would like to talk to nurse or Patty about results and issues.

## 2015-11-15 NOTE — Telephone Encounter (Signed)
Referral was done  

## 2015-11-27 ENCOUNTER — Ambulatory Visit: Payer: BC Managed Care – PPO

## 2015-11-28 ENCOUNTER — Ambulatory Visit (INDEPENDENT_AMBULATORY_CARE_PROVIDER_SITE_OTHER): Payer: BC Managed Care – PPO | Admitting: Certified Nurse Midwife

## 2015-11-28 ENCOUNTER — Encounter: Payer: Self-pay | Admitting: Certified Nurse Midwife

## 2015-11-28 VITALS — BP 100/64 | HR 68 | Resp 16 | Ht 67.0 in | Wt 156.0 lb

## 2015-11-28 DIAGNOSIS — Z3042 Encounter for surveillance of injectable contraceptive: Secondary | ICD-10-CM

## 2015-11-28 DIAGNOSIS — R635 Abnormal weight gain: Secondary | ICD-10-CM | POA: Diagnosis not present

## 2015-11-28 MED ORDER — MEDROXYPROGESTERONE ACETATE 150 MG/ML IM SUSP
150.0000 mg | Freq: Once | INTRAMUSCULAR | Status: AC
  Administered 2015-11-28: 150 mg via INTRAMUSCULAR

## 2015-11-28 NOTE — Progress Notes (Signed)
Patient ID: Misty SaxonRebecca E Swavely, female   DOB: [birth year], 35 y.o.   MRN: [id number]  Depo Provera injection given at office visit today.  Pt tolerated injection well in left gluteal. Last AEX - 01/09/15 with Shirlyn GoltzPatty Grubb, FNP Next AEX - 01/10/16 with Shirlyn GoltzPatty Grubb, FNP Last Depo Provera Given - 09/06/15 Pt is within due dates. (11/9-11/23) Pt should return between 02/13/16 and 02/27/16 for next Depo Provera injection.  Patient is aware of next annual exam appointment date and was advised she must have annual exam prior to next Depo Provera injection.  Patient voices understanding.

## 2015-11-28 NOTE — Patient Instructions (Signed)
High-Protein and High-Calorie Diet Introduction Eating high-protein and high-calorie foods can help you to gain weight, heal after an injury, and recover after an illness or surgery. What is my plan? The specific amount of daily protein and calories you need depends on:  Your body weight.  The reason this diet is recommended for you. Generally, a high-protein, high-calorie diet involves:  Eating 250-500 extra calories each day.  Making sure that 10-35% of your daily calories come from protein. Talk to your health care provider about how much protein and how many calories you need each day. Follow the diet as directed by your health care provider. What do I need to know about this diet?  Ask your health care provider if you should take a nutritional supplement.  Try to eat six small meals each day instead of three large meals.  Eat a balanced diet, including one food that is high in protein at each meal.  Keep nutritious snacks handy, such as nuts, trail mixes, dried fruit, and yogurt.  If you have kidney disease or diabetes, eating too much protein may put extra stress on your kidneys. Talk to your health care provider if you have either of those conditions. What are some high-protein foods? Grains  Quinoa. Bulgur wheat. Vegetables  Soybeans. Peas. Meats and Other Protein Sources  Beef, pork, and poultry. Fish and seafood. Eggs. Tofu. Textured vegetable protein (TVP). Peanut butter. Nuts and seeds. Dried beans. Protein powders. Dairy  Whole milk. Whole-milk yogurt. Powdered milk. Cheese. Cottage Cheese. Eggnog. Beverages  High-protein supplement drinks. Soy milk. Other  Protein bars. The items listed above may not be a complete list of recommended foods or beverages. Contact your dietitian for more options.  What are some high-calorie foods? Grains  Pasta. Quick breads. Muffins. Pancakes. Ready-to-eat cereal. Vegetables  Vegetables cooked in oil or butter. Fried  potatoes. Fruits  Dried fruit. Fruit leather. Canned fruit in syrup. Fruit juice. Avocados. Meats and Other Protein Sources  Peanut butter. Nuts and seeds. Dairy  Heavy cream. Whipped cream. Cream cheese. Sour cream. Ice cream. Custard. Pudding. Beverages  Meal-replacement beverages. Nutrition shakes. Fruit juice. Sugar-sweetened soft drinks. Condiments  Salad dressing. Mayonnaise. Alfredo sauce. Fruit preserves or jelly. Honey. Syrup. Sweets/Desserts  Cake. Cookies. Pie. Pastries. Candy bars. Chocolate. Fats and Oils  Butter or margarine. Oil. Gravy. Other  Meal-replacement bars. The items listed above may not be a complete list of recommended foods or beverages. Contact your dietitian for more options.  What are some tips for including high-protein and high-calorie foods in my diet?  Add whole milk, half-and-half, or heavy cream to cereal, pudding, soup, or hot cocoa.  Add whole milk to instant breakfast drinks.  Add peanut butter to oatmeal or smoothies.  Add powdered milk to baked goods, smoothies, or milkshakes.  Add powdered milk, cream, or butter to mashed potatoes.  Add cheese to cooked vegetables.  Make whole-milk yogurt parfaits. Top them with granola, fruit, or nuts.  Add cottage cheese to your fruit.  Add avocados, cheese, or both to sandwiches or salads.  Add meat, poultry, or seafood to rice, pasta, casseroles, salads, and soups.  Use mayonnaise when making egg salad, chicken salad, or tuna salad.  Use peanut butter as a topping for pretzels, celery, or crackers.  Add beans to casseroles, dips, and spreads.  Add pureed beans to sauces and soups.  Replace calorie-free drinks with calorie-containing drinks, such as milk and fruit juice. This information is not intended to replace advice given to   you by your health care provider. Make sure you discuss any questions you have with your health care provider. Document Released: 12/30/2004 Document Revised:  06/07/2015 Document Reviewed: 06/14/2013  2017 Elsevier  

## 2015-11-28 NOTE — Progress Notes (Signed)
Subjective:     Patient ID: Misty Sanchez, female   DOB: [birth year], 35 y.o.   MRN: [id number]  HPI 35 yo g0p0 single white female with complaint of 10 pound weight gain over the past 3 years. Experiencing fatigue and feels she needs to sleep all the time. Works full time, struggles with finances and buying food. Works out at gym 4-5 times weekly with Systems analystpersonal trainer and has had nutrition evaluation. Was seen 11/02/15 by PCP for evaluation of fatigue with lab work and exam all normal.( results in epic). Frustrated!! Discussed Depo Provera use and potential for weight gain. Patient is aware and (will not go off due to dysmenorrhea, which is not present now with periods if she has them). Denies eating high calorie foods, but does eat cereal as breakfast meal and snacks on. Eats no fruit, does not like. Some vegetables, but mainly carbohydrates. Drinks water and some soda. No other health issues today.No exam needed, just consult only desired.   Review of Systems  Constitutional: Positive for fatigue. Negative for appetite change.  HENT: Negative.   Respiratory: Negative.   Cardiovascular: Negative.   Gastrointestinal: Negative.   Endocrine: Negative.   Genitourinary: Negative.   Skin: Negative.   Neurological: Negative.   Psychiatric/Behavioral: Negative for sleep disturbance.       Objective:   Physical Exam  Constitutional: She appears well-developed and well-nourished.  Psychiatric: She has a normal mood and affect. Judgment and thought content normal.       Assessment:     Weight gain of 10 pounds over the past 3 years, now on weight loss with no change. Dietary intake related. Contraception Depo Provera with weight gain as side effect, but history dysmenorrhea Inadequate protein intake with diet R/O Vitamin D deficiency Recent labs with PCP normal    Plan:     Discussed continuation of Depo Provera, due to good cycle control with dysmenorrhea. Due today Discussed need to  start day off with protein intake in breakfast meal and not skip meals, which gives metabolism boost for weight loss. Reviewed dietary means of protein and complex carbs for better choices. Discussed limit salt intake and boxed meals. Handout given. Encouraged patient to make menu and prepare ahead of time, to make sure she is consistent and will help with financial planning for purchases of food needed. Questions addressed. Patient will My chart progress and will gain support of trainer with exercise. Lab : Vitamin D Depo Provera injection today.  Rv prn/aex    28 minutes in face to face discussion of weight gain/diet and contraception use.

## 2015-11-29 LAB — VITAMIN D 25 HYDROXY (VIT D DEFICIENCY, FRACTURES): Vit D, 25-Hydroxy: 31 ng/mL (ref 30–100)

## 2015-12-04 NOTE — Progress Notes (Signed)
Encounter reviewed Nathaniel Yaden, MD   

## 2016-01-02 ENCOUNTER — Encounter: Payer: Self-pay | Admitting: Adult Health

## 2016-01-02 ENCOUNTER — Ambulatory Visit (INDEPENDENT_AMBULATORY_CARE_PROVIDER_SITE_OTHER): Payer: BC Managed Care – PPO | Admitting: Adult Health

## 2016-01-02 VITALS — BP 110/70 | HR 94 | Temp 97.8°F | Ht 67.0 in | Wt 159.9 lb

## 2016-01-02 DIAGNOSIS — R000 Tachycardia, unspecified: Secondary | ICD-10-CM | POA: Diagnosis not present

## 2016-01-02 MED ORDER — PROPRANOLOL HCL 10 MG PO TABS: 10.0000 mg | ORAL_TABLET | Freq: Every day | ORAL | 3 refills | Status: DC | PRN

## 2016-01-02 MED ORDER — ALLOPURINOL 100 MG PO TABS: 100.0000 mg | ORAL_TABLET | Freq: Every day | ORAL | 3 refills | Status: DC | PRN

## 2016-01-02 NOTE — Patient Instructions (Signed)
It was great meeting you today   I sent in a medication called Propranolol, take this medication one hour prior to your appointment   Follow up with Dr. Clent RidgesFry in one month

## 2016-01-02 NOTE — Progress Notes (Signed)
Subjective:    Patient ID: Misty Sanchez, female    DOB: [birth year], 35 y.o.   MRN: [id number]  HPI  35 year old female who presents to the office today for eleavted pulse. She reports that she has been donating plasma to make extra money for the holidays. She reports that when she has been going her pulse has been elevated and they will not let her donate. She feels as though this is from anxiety.   She does not complain of chest pain/tightness, shortness of breath or palpitations  Review of Systems  Constitutional: Negative.   Respiratory: Negative.   Cardiovascular: Negative.   Neurological: Negative.   All other systems reviewed and are negative.  Past Medical History:  Diagnosis Date  . ADHD (attention deficit hyperactivity disorder)   . Frequent UTI   . IC (interstitial cystitis)   . On Depo-Provera for contraception 08/2002    Social History   Social History  . Marital status: Single    Spouse name: N/A  . Number of children: N/A  . Years of education: N/A   Occupational History  . Not on file.   Social History Main Topics  . Smoking status: Never Smoker  . Smokeless tobacco: Never Used  . Alcohol use No  . Drug use: No  . Sexual activity: No     Comment: depo provera 150mg    Other Topics Concern  . Not on file   Social History Narrative  . No narrative on file    Past Surgical History:  Procedure Laterality Date  . TYMPANOSTOMY TUBE PLACEMENT Bilateral 03/2013  . WISDOM TOOTH EXTRACTION      Family History  Problem Relation Age of Onset  . Hypertension Mother   . Diabetes Paternal Uncle   . Cancer Maternal Grandmother     gland behind the ear  . Celiac disease Father     Allergies  Allergen Reactions  . Azithromycin     Nausea ( z-pack )    Current Outpatient Prescriptions on File Prior to Visit  Medication Sig Dispense Refill  . Calcium-Vitamin D-Vitamin K (VIACTIV PO) Take by mouth daily.    . medroxyPROGESTERone (DEPO-PROVERA)  150 MG/ML injection Inject 150 mg into the muscle every 3 (three) months.     No current facility-administered medications on file prior to visit.     BP 110/70   Pulse 94   Temp 97.8 F (36.6 C) (Oral)   Ht 5\' 7"  (1.702 m)   Wt 159 lb 14.4 oz (72.5 kg)   BMI 25.04 kg/m       Objective:   Physical Exam  Constitutional: She is oriented to person, place, and time. She appears well-developed and well-nourished. No distress.  Cardiovascular: Normal rate, regular rhythm, normal heart sounds and intact distal pulses.  Exam reveals no gallop and no friction rub.   No murmur heard. Pulmonary/Chest: Effort normal and breath sounds normal. No respiratory distress. She has no wheezes. She has no rales. She exhibits no tenderness.  Neurological: She is alert and oriented to person, place, and time.  Skin: Skin is warm and dry. No rash noted. She is not diaphoretic. No erythema. No pallor.  Psychiatric: She has a normal mood and affect. Her behavior is normal. Judgment and thought content normal.  Nursing note and vitals reviewed.     Assessment & Plan:  1. Elevated pulse rate - Healthy female. She is ok to donate plasma. Will prescribe propranolol to take one hour  prior to appointment  - propranolol (INDERAL) 10 MG tablet; Take 1 tablet (10 mg total) by mouth daily as needed. One hour prior to appointment  Dispense: 30 tablet; Refill: 3 - Follow up with Dr. Clent RidgesFry as needed  Shirline Freesory Shyenne Maggard, NP

## 2016-01-10 ENCOUNTER — Ambulatory Visit: Payer: Self-pay | Admitting: Nurse Practitioner

## 2016-01-11 ENCOUNTER — Encounter: Payer: Self-pay | Admitting: Certified Nurse Midwife

## 2016-01-11 ENCOUNTER — Ambulatory Visit (INDEPENDENT_AMBULATORY_CARE_PROVIDER_SITE_OTHER): Payer: BC Managed Care – PPO | Admitting: Certified Nurse Midwife

## 2016-01-11 VITALS — BP 112/70 | HR 64 | Resp 16 | Ht 66.75 in | Wt 160.0 lb

## 2016-01-11 DIAGNOSIS — Z23 Encounter for immunization: Secondary | ICD-10-CM

## 2016-01-11 DIAGNOSIS — Z124 Encounter for screening for malignant neoplasm of cervix: Secondary | ICD-10-CM

## 2016-01-11 DIAGNOSIS — Z3042 Encounter for surveillance of injectable contraceptive: Secondary | ICD-10-CM | POA: Diagnosis not present

## 2016-01-11 DIAGNOSIS — Z01419 Encounter for gynecological examination (general) (routine) without abnormal findings: Secondary | ICD-10-CM

## 2016-01-11 MED ORDER — MEDROXYPROGESTERONE ACETATE 150 MG/ML IM SUSP: 150.0000 mg | INTRAMUSCULAR | 4 refills | Status: DC

## 2016-01-11 NOTE — Patient Instructions (Signed)

## 2016-01-11 NOTE — Progress Notes (Signed)
Reviewed personally.  M. Suzanne Tahjae Durr, MD.  

## 2016-01-11 NOTE — Progress Notes (Signed)
35 y.o. G0P0 Single  Caucasian Fe here for annual exam. Periods none with Depo Provera use for contraception.  Exercises daily, no health issues.  Sees Dr.Fry for Inderal management, only used when donating plasma to help with anxiety. This is working well. Still working on weight change, even though normal range. No other health issues today. [place]  No LMP recorded. Patient has had an injection.          Sexually active: No.  The current method of family planning is Depo-Provera injections.    Exercising: Yes.    cardio, weights, exercise bike Smoker:  no  Health Maintenance: Pap:  11-18-12 neg HPV HR neg MMG:  none Colonoscopy: none BMD:   none TDaP:  2008 Shingles: no Pneumonia: no Hep C and HIV: not done Labs: cbc done 10/17 Self breast exam: done occ   reports that she has never smoked. She has never used smokeless tobacco. She reports that she does not drink alcohol or use drugs.  Past Medical History:  Diagnosis Date  . ADHD (attention deficit hyperactivity disorder)   . Frequent UTI   . IC (interstitial cystitis)   . On Depo-Provera for contraception 08/2002    Past Surgical History:  Procedure Laterality Date  . TYMPANOSTOMY TUBE PLACEMENT Bilateral 03/2013  . WISDOM TOOTH EXTRACTION      Current Outpatient Prescriptions  Medication Sig Dispense Refill  . Calcium-Vitamin D-Vitamin K (VIACTIV PO) Take by mouth daily.    . Cholecalciferol (VITAMIN D PO) Take by mouth daily.    . IRON PO Take by mouth daily.    . medroxyPROGESTERone (DEPO-PROVERA) 150 MG/ML injection Inject 150 mg into the muscle every 3 (three) months.    . Multiple Vitamins-Minerals (MULTIVITAMIN PO) Take by mouth daily.    . propranolol (INDERAL) 10 MG tablet Take 1 tablet (10 mg total) by mouth daily as needed. One hour prior to appointment 30 tablet 3   No current facility-administered medications for this visit.     Family History  Problem Relation Age of  Onset  . Hypertension Mother   . Diabetes Paternal Uncle   . Cancer Maternal Grandmother     gland behind the ear  . Celiac disease Father     ROS:  Pertinent items are noted in HPI.  Otherwise, a comprehensive ROS was negative.  Exam:   BP 112/70   Pulse 64   Resp 16   Ht 5' 6.75" (1.695 m)   Wt 160 lb (72.6 kg)   BMI 25.25 kg/m  Height: 5' 6.75" (169.5 cm) Ht Readings from Last 3 Encounters:  01/11/16 5' 6.75" (1.695 m)  01/02/16 5\' 7"  (1.702 m)  11/28/15 5\' 7"  (1.702 m)    General appearance: alert, cooperative and appears stated age Head: Normocephalic, without obvious abnormality, atraumatic Neck: no adenopathy, supple, symmetrical, trachea midline and thyroid normal to inspection and palpation Lungs: clear to auscultation bilaterally Breasts: normal appearance, no masses or tenderness, No nipple retraction or dimpling, No nipple discharge or bleeding, No axillary or supraclavicular adenopathy Heart: regular rate and rhythm Abdomen: soft, non-tender; no masses,  no organomegaly Extremities: extremities normal, atraumatic, no cyanosis or edema Skin: Skin color, texture, turgor normal. No rashes or lesions Lymph nodes: Cervical, supraclavicular, and axillary nodes normal. No abnormal inguinal nodes palpated Neurologic: Grossly normal   Pelvic: External genitalia:  no lesions              Urethra:  normal appearing urethra  with no masses, tenderness or lesions              Bartholin's and Skene's: normal                 Vagina: normal appearing vagina with normal color and discharge, no lesions              Cervix: no cervical motion tenderness, no lesions and nulliparous appearance              Pap taken: Yes.   Bimanual Exam:  Uterus:  normal size, contour, position, consistency, mobility, non-tender anteflexed              Adnexa: normal adnexa and no mass, fullness, tenderness               Rectovaginal: Confirms               Anus:  normal appearance, declines  rectal exam  Chaperone present: yes  A:  Well Woman with normal exam  Contraception for cycle control Depo Provera desired  Immunization update  P:   Reviewed health and wellness pertinent to exam  Risks and benefits and bleeding profile reviewed with patient.  Rx Depo Provera 150 mg IM every 3 months x 1 year  Requests TDAP  Pap smear as above with HPVHR    Reviewed need for SBE monthly, healthy diet with exercise, calcium importance along with Vitatmin D.  return annually or prn  An After Visit Summary was printed and given to the patient.

## 2016-01-16 LAB — IPS PAP TEST WITH REFLEX TO HPV

## 2016-01-29 ENCOUNTER — Telehealth: Payer: Self-pay | Admitting: Nurse Practitioner

## 2016-01-29 NOTE — Telephone Encounter (Signed)
Spoke with patient. Patient states she is at work and unable to go into detail. Patient states she has always had anxiety but has become more anxious over last 2 months. Patient states gets panicky during day and crying more. Patient states mom and grandmother have history of anxiety. Patient would like to know if Ria CommentPatricia Grubb, NP has any appointments available at end of this week? Patient scheduled with Ria CommentPatricia Grubb, NP on 02/01/16 at 12:45pm. Patient agreeable to date and time. Last AEX 01/11/16 with Leota Sauerseborah Leonard, CNM  Routing to provider for final review. Patient is agreeable to disposition. Will close encounter.  Cc: Leota Sauerseborah Leonard, CNM

## 2016-01-29 NOTE — Telephone Encounter (Signed)
Patient called and said, "I'd like to schedule an appointment with Patty or Debbi for a consultation about anxiety I am having."

## 2016-02-01 ENCOUNTER — Ambulatory Visit: Payer: Self-pay | Admitting: Nurse Practitioner

## 2016-02-06 ENCOUNTER — Ambulatory Visit (INDEPENDENT_AMBULATORY_CARE_PROVIDER_SITE_OTHER): Payer: BC Managed Care – PPO | Admitting: Nurse Practitioner

## 2016-02-06 VITALS — BP 120/80 | HR 80 | Resp 16 | Ht 66.75 in | Wt 158.4 lb

## 2016-02-06 DIAGNOSIS — F418 Other specified anxiety disorders: Secondary | ICD-10-CM | POA: Diagnosis not present

## 2016-02-06 MED ORDER — FLUOXETINE HCL 10 MG PO TABS: 10.0000 mg | ORAL_TABLET | Freq: Every day | ORAL | 0 refills | Status: DC

## 2016-02-06 NOTE — Progress Notes (Signed)
Patient ID: Misty SaxonRebecca E Sanchez, female   DOB: 06-16-1980, 36 y.o.   MRN: [id number]  S: This 36 yo WS Female G0P0 states she would like to discuss options for her anxiety. Patient states she has always had anxiety but has become more anxious over last 2 months. Patient states gets panicky during day and crying more. Patient states mom and grandmother have history of anxiety. 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  She is not dating and never SA.  She has always had problems with OCD but that is also worse right now.  Mother - pt here is on Prozac 20 mg for the same problem and does well.  She has tried biofeedback with the anxiety attacks and that does not always help.  O: Pt is well know to me for yrs. She is always anxious and today does reflect similar behavior.  She answers questions appropriately and denies nay thought about harming herself or others.  A: Situational anxiety   FMH of same with mother and grandmother  History of ADD - no current med's  Plan: Will start her on Prozac 10 mg daily - enough for 3 months only.  Then will have her to return.  She is read the potential side effects and warnings.  If any worsening of symptoms or suicidal thoughts to stop med and call ASAP.  Recheck apt for 3 months.  Consult time: 15 minutes face to face.

## 2016-02-06 NOTE — Patient Instructions (Signed)
Generalized Anxiety Disorder Generalized anxiety disorder (GAD) is a mental disorder. It interferes with life functions, including relationships, work, and school. GAD is different from normal anxiety, which everyone experiences at some point in their lives in response to specific life events and activities. Normal anxiety actually helps us prepare for and get through these life events and activities. Normal anxiety goes away after the event or activity is over.  GAD causes anxiety that is not necessarily related to specific events or activities. It also causes excess anxiety in proportion to specific events or activities. The anxiety associated with GAD is also difficult to control. GAD can vary from mild to severe. People with severe GAD can have intense waves of anxiety with physical symptoms (panic attacks).  SYMPTOMS The anxiety and worry associated with GAD are difficult to control. This anxiety and worry are related to many life events and activities and also occur more days than not for 6 months or longer. People with GAD also have three or more of the following symptoms (one or more in children):  Restlessness.   Fatigue.  Difficulty concentrating.   Irritability.  Muscle tension.  Difficulty sleeping or unsatisfying sleep. DIAGNOSIS GAD is diagnosed through an assessment by your health care provider. Your health care provider will ask you questions aboutyour mood,physical symptoms, and events in your life. Your health care provider may ask you about your medical history and use of alcohol or drugs, including prescription medicines. Your health care provider may also do a physical exam and blood tests. Certain medical conditions and the use of certain substances can cause symptoms similar to those associated with GAD. Your health care provider may refer you to a mental health specialist for further evaluation. TREATMENT The following therapies are usually used to treat GAD:    Medication. Antidepressant medication usually is prescribed for long-term daily control. Antianxiety medicines may be added in severe cases, especially when panic attacks occur.   Talk therapy (psychotherapy). Certain types of talk therapy can be helpful in treating GAD by providing support, education, and guidance. A form of talk therapy called cognitive behavioral therapy can teach you healthy ways to think about and react to daily life events and activities.  Stress managementtechniques. These include yoga, meditation, and exercise and can be very helpful when they are practiced regularly. A mental health specialist can help determine which treatment is best for you. Some people see improvement with one therapy. However, other people require a combination of therapies. This information is not intended to replace advice given to you by your health care provider. Make sure you discuss any questions you have with your health care provider. Document Released: 04/26/2012 Document Revised: 01/20/2014 Document Reviewed: 04/26/2012 Elsevier Interactive Patient Education  2017 [employer] Inc.  

## 2016-02-10 ENCOUNTER — Encounter: Payer: Self-pay | Admitting: Nurse Practitioner

## 2016-02-10 NOTE — Progress Notes (Signed)
Encounter reviewed by Dr. Brook Amundson C. Silva.  

## 2016-02-19 ENCOUNTER — Ambulatory Visit (INDEPENDENT_AMBULATORY_CARE_PROVIDER_SITE_OTHER): Payer: BC Managed Care – PPO

## 2016-02-19 VITALS — BP 110/60 | HR 78 | Resp 20 | Ht 67.0 in | Wt 160.2 lb

## 2016-02-19 DIAGNOSIS — Z3042 Encounter for surveillance of injectable contraceptive: Secondary | ICD-10-CM

## 2016-02-19 MED ORDER — MEDROXYPROGESTERONE ACETATE 150 MG/ML IM SUSP
150.0000 mg | Freq: Once | INTRAMUSCULAR | Status: AC
  Administered 2016-02-19: 150 mg via INTRAMUSCULAR

## 2016-02-19 NOTE — Progress Notes (Signed)
Patient is here for Depo Provera Injection Patient is within Depo Provera Calender Limits Yes, 11/28/15 1/31/ - 2/14 Next Depo Due between: 4/24 - 5/8 Last AEX: 01/11/16 DL AEX Scheduled: 1/61/094/24/18 PG ( 3 month f/u of meds)  Patient is aware when next depo is due  Pt tolerated Injection well. Injection given in R Glute.   Routed to provider for review, encounter closed.

## 2016-05-06 ENCOUNTER — Encounter: Payer: Self-pay | Admitting: Nurse Practitioner

## 2016-05-06 ENCOUNTER — Ambulatory Visit (INDEPENDENT_AMBULATORY_CARE_PROVIDER_SITE_OTHER): Payer: BC Managed Care – PPO | Admitting: Nurse Practitioner

## 2016-05-06 VITALS — BP 110/74 | HR 60 | Ht 67.0 in | Wt 159.0 lb

## 2016-05-06 DIAGNOSIS — Z3042 Encounter for surveillance of injectable contraceptive: Secondary | ICD-10-CM

## 2016-05-06 DIAGNOSIS — F418 Other specified anxiety disorders: Secondary | ICD-10-CM | POA: Diagnosis not present

## 2016-05-06 MED ORDER — FLUOXETINE HCL 10 MG PO TABS: 10.0000 mg | ORAL_TABLET | Freq: Every day | ORAL | 3 refills | Status: DC

## 2016-05-06 MED ORDER — MEDROXYPROGESTERONE ACETATE 150 MG/ML IM SUSP
150.0000 mg | Freq: Once | INTRAMUSCULAR | Status: AC
  Administered 2016-05-06: 150 mg via INTRAMUSCULAR

## 2016-05-06 NOTE — Patient Instructions (Signed)
Generalized Anxiety Disorder, Adult Generalized anxiety disorder (GAD) is a mental health disorder. People with this condition constantly worry about everyday events. Unlike normal anxiety, worry related to GAD is not triggered by a specific event. These worries also do not fade or get better with time. GAD interferes with life functions, including relationships, work, and school. GAD can vary from mild to severe. People with severe GAD can have intense waves of anxiety with physical symptoms (panic attacks). What are the causes? The exact cause of GAD is not known. What increases the risk? This condition is more likely to develop in:  Women.  People who have a family history of anxiety disorders.  People who are very shy.  People who experience very stressful life events, such as the death of a loved one.  People who have a very stressful family environment. What are the signs or symptoms? People with GAD often worry excessively about many things in their lives, such as their health and family. They may also be overly concerned about:  Doing well at work.  Being on time.  Natural disasters.  Friendships. Physical symptoms of GAD include:  Fatigue.  Muscle tension or having muscle twitches.  Trembling or feeling shaky.  Being easily startled.  Feeling like your heart is pounding or racing.  Feeling out of breath or like you cannot take a deep breath.  Having trouble falling asleep or staying asleep.  Sweating.  Nausea, diarrhea, or irritable bowel syndrome (IBS).  Headaches.  Trouble concentrating or remembering facts.  Restlessness.  Irritability. How is this diagnosed? Your health care provider can diagnose GAD based on your symptoms and medical history. You will also have a physical exam. The health care provider will ask specific questions about your symptoms, including how severe they are, when they started, and if they come and go. Your health care  provider may ask you about your use of alcohol or drugs, including prescription medicines. Your health care provider may refer you to a mental health specialist for further evaluation. Your health care provider will do a thorough examination and may perform additional tests to rule out other possible causes of your symptoms. To be diagnosed with GAD, a person must have anxiety that:  Is out of his or her control.  Affects several different aspects of his or her life, such as work and relationships.  Causes distress that makes him or her unable to take part in normal activities.  Includes at least three physical symptoms of GAD, such as restlessness, fatigue, trouble concentrating, irritability, muscle tension, or sleep problems. Before your health care provider can confirm a diagnosis of GAD, these symptoms must be present more days than they are not, and they must last for six months or longer. How is this treated? The following therapies are usually used to treat GAD:  Medicine. Antidepressant medicine is usually prescribed for long-term daily control. Antianxiety medicines may be added in severe cases, especially when panic attacks occur.  Talk therapy (psychotherapy). Certain types of talk therapy can be helpful in treating GAD by providing support, education, and guidance. Options include:  Cognitive behavioral therapy (CBT). People learn coping skills and techniques to ease their anxiety. They learn to identify unrealistic or negative thoughts and behaviors and to replace them with positive ones.  Acceptance and commitment therapy (ACT). This treatment teaches people how to be mindful as a way to cope with unwanted thoughts and feelings.  Biofeedback. This process trains you to manage your body's response (  physiological response) through breathing techniques and relaxation methods. You will work with a therapist while machines are used to monitor your physical symptoms.  Stress  management techniques. These include yoga, meditation, and exercise. A mental health specialist can help determine which treatment is best for you. Some people see improvement with one type of therapy. However, other people require a combination of therapies. Follow these instructions at home:  Take over-the-counter and prescription medicines only as told by your health care provider.  Try to maintain a normal routine.  Try to anticipate stressful situations and allow extra time to manage them.  Practice any stress management or self-calming techniques as taught by your health care provider.  Do not punish yourself for setbacks or for not making progress.  Try to recognize your accomplishments, even if they are small.  Keep all follow-up visits as told by your health care provider. This is important. Contact a health care provider if:  Your symptoms do not get better.  Your symptoms get worse.  You have signs of depression, such as:  A persistently sad, cranky, or irritable mood.  Loss of enjoyment in activities that used to bring you joy.  Change in weight or eating.  Changes in sleeping habits.  Avoiding friends or family members.  Loss of energy for normal tasks.  Feelings of guilt or worthlessness. Get help right away if:  You have serious thoughts about hurting yourself or others. If you ever feel like you may hurt yourself or others, or have thoughts about taking your own life, get help right away. You can go to your nearest emergency department or call:  Your local emergency services (911 in the [place]  A suicide crisis helpline, such as the National Suicide Prevention Lifeline at 1-800-273-8255. This is open 24 hours a day. Summary  Generalized anxiety disorder (GAD) is a mental health disorder that involves worry that is not triggered by a specific event.  People with GAD often worry excessively about many things in their lives, such as their health and  family.  GAD may cause physical symptoms such as restlessness, trouble concentrating, sleep problems, frequent sweating, nausea, diarrhea, headaches, and trembling or muscle twitching.  A mental health specialist can help determine which treatment is best for you. Some people see improvement with one type of therapy. However, other people require a combination of therapies. This information is not intended to replace advice given to you by your health care provider. Make sure you discuss any questions you have with your health care provider. Document Released: 04/26/2012 Document Revised: 11/20/2015 Document Reviewed: 11/20/2015 Elsevier Interactive Patient Education  2017 [employer]  

## 2016-05-06 NOTE — Progress Notes (Signed)
Pt arrived for Depo Provera injection.  Pt tolerated injection well in left gluteal. Last AEX - 01/11/16 Last Depo Provera Given - 02/19/16 Pt is within due dates. Pt should return between 07/22/16 and 08/05/16 for next Depo Provera injection.

## 2016-05-06 NOTE — Progress Notes (Signed)
GYNECOLOGY  VISIT   HPI: 36 y.o.   Single  Caucasian  female   G0P0 with No LMP recorded. Patient has had an injection.   Pt is here for follow up on anxiety therapy.  She came in for AEX in January and stated that she always had anxiety but her symptoms of anxiety had worsened over last 2 months. Patient states gets panicky during day and crying more. FMH:  mom and grandmother have history of anxiety and mother is on Prozac and does well. 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  She is not dating and never SA.  History of OCD but that was aggravated with anxiety.  She has tried biofeedback with the anxiety attacks and that does not always help.  Since January has been on Prozac and comes in now for a follow up.  Less anxiety, and less OCD.  Able to cope better with work stressors. When something bothers her she lets it go more easily and feels calmer about the event. Denies suicidal ideation and no weight gain.  She is also here for Depo Provera.  GYNECOLOGIC HISTORY: No LMP recorded. Patient has had an injection. Contraception:  Depo Provera        OB History    Gravida Para Term Preterm AB Living   0 0 0 0 0 0   SAB TAB Ectopic Multiple Live Births   0 0 0 0 0         Patient Active Problem List   Diagnosis Date Noted  . Dysmenorrhea 05/16/2013    Class: History of  . ACUTE SINUSITIS, UNSPECIFIED 02/04/2010  . UTI'S, CHRONIC 08/12/2006  . ADHD 07/29/2006  . CHICKENPOX, HX OF 07/29/2006    Past Medical History:  Diagnosis Date  . ADHD (attention deficit hyperactivity disorder)   . Frequent UTI   . IC (interstitial cystitis)   . On Depo-Provera for contraception 08/2002    Past Surgical History:  Procedure Laterality Date  . TYMPANOSTOMY TUBE PLACEMENT Bilateral 03/2013  . WISDOM TOOTH EXTRACTION      Current Outpatient Prescriptions  Medication Sig Dispense Refill  . Calcium-Vitamin D-Vitamin K  (VIACTIV PO) Take by mouth daily.    . Cholecalciferol (VITAMIN D PO) Take by mouth daily.    Marland Kitchen FLUoxetine (PROZAC) 10 MG tablet Take 1 tablet (10 mg total) by mouth daily. 90 tablet 0  . IRON PO Take by mouth daily.    . medroxyPROGESTERone (DEPO-PROVERA) 150 MG/ML injection Inject 1 mL (150 mg total) into the muscle every 3 (three) months. 1 mL 4  . Multiple Vitamins-Minerals (MULTIVITAMIN PO) Take by mouth daily.    . propranolol (INDERAL) 10 MG tablet Take 1 tablet (10 mg total) by mouth daily as needed. One hour prior to appointment 30 tablet 3   No current facility-administered medications for this visit.      ALLERGIES: Azithromycin  Family History  Problem Relation Age of Onset  . Hypertension Mother   . Celiac disease Father   . Diabetes Paternal Uncle   . Drug abuse Paternal Uncle     heroin  . Cancer Maternal Grandmother     gland behind the ear    Social History   Social History  . Marital status: Single    Spouse name: N/A  . Number of children: N/A  . Years of education: N/A   Occupational History  . Not on  file.   Social History Main Topics  . Smoking status: Never Smoker  . Smokeless tobacco: Never Used  . Alcohol use No  . Drug use: No  . Sexual activity: No     Comment: depo provera    Other Topics Concern  . Not on file   Social History Narrative  . No narrative on file    ROS  PHYSICAL EXAMINATION:    BP 110/74 (BP Location: Right Arm, Patient Position: Sitting, Cuff Size: Normal)   Pulse 60   Ht  (1.702 m)   Wt 159 lb (72.1 kg)   BMI 24.90 kg/m     General appearance: alert, cooperative and appears stated age Overall she appears less stressed.  Is calm about discussion of stressors.     ASSESSMENT  History of generalized anxiety disorder  History of OCD  Symptoms better on Prozac 10 mg daily  Injection of Depo Provera.    PLAN  Will refill medication until next AEX.  She is aware to let us know if symptoms get  worse  RTO 07/2016 for next Depo Provera    15 minutes face to face time of which over 50% was spent in counseling.

## 2016-05-08 NOTE — Progress Notes (Signed)
Encounter reviewed by Dr. Brook Amundson C. Silva.  

## 2016-07-29 ENCOUNTER — Ambulatory Visit (INDEPENDENT_AMBULATORY_CARE_PROVIDER_SITE_OTHER): Payer: BC Managed Care – PPO | Admitting: *Deleted

## 2016-07-29 VITALS — BP 104/66 | HR 60 | Ht 67.0 in | Wt 162.0 lb

## 2016-07-29 DIAGNOSIS — Z3042 Encounter for surveillance of injectable contraceptive: Secondary | ICD-10-CM | POA: Diagnosis not present

## 2016-07-29 MED ORDER — MEDROXYPROGESTERONE ACETATE 150 MG/ML IM SUSP
150.0000 mg | Freq: Once | INTRAMUSCULAR | Status: AC
  Administered 2016-07-29: 150 mg via INTRAMUSCULAR

## 2016-07-29 NOTE — Progress Notes (Signed)
Pt arrived for Depo Provera injection.  Pt tolerated injection well in right gluteal. Last AEX - 01/11/16 Last Depo Provera Given - 05/06/13 Pt is within due dates. Pt should return between 10/14/16 and 11/05/16 for next Depo Provera injection.

## 2016-10-02 ENCOUNTER — Encounter: Payer: Self-pay | Admitting: Family Medicine

## 2016-10-14 ENCOUNTER — Ambulatory Visit (INDEPENDENT_AMBULATORY_CARE_PROVIDER_SITE_OTHER): Payer: BC Managed Care – PPO | Admitting: *Deleted

## 2016-10-14 VITALS — BP 100/60 | HR 84 | Resp 16 | Ht 67.0 in | Wt 166.0 lb

## 2016-10-14 DIAGNOSIS — Z3042 Encounter for surveillance of injectable contraceptive: Secondary | ICD-10-CM

## 2016-10-14 MED ORDER — MEDROXYPROGESTERONE ACETATE 150 MG/ML IM SUSP
150.0000 mg | Freq: Once | INTRAMUSCULAR | Status: AC
  Administered 2016-10-14: 150 mg via INTRAMUSCULAR

## 2016-10-14 NOTE — Progress Notes (Signed)
Patient is here for Depo Provera Injection Patient is within Depo Provera Calender Limits: yes, last depo 07/29/16  Next Depo Due between: 12/18-1/1 Last AEX: 01/11/16 DL AEX Scheduled: none. Will schedule   Patient is aware when next depo is due  Pt tolerated Injection well. Left gluteal today   Routed to provider for review, encounter closed.

## 2016-12-31 ENCOUNTER — Other Ambulatory Visit: Payer: Self-pay

## 2016-12-31 ENCOUNTER — Ambulatory Visit (INDEPENDENT_AMBULATORY_CARE_PROVIDER_SITE_OTHER): Payer: BC Managed Care – PPO

## 2016-12-31 VITALS — BP 110/66 | HR 80 | Resp 16 | Wt 177.0 lb

## 2016-12-31 DIAGNOSIS — Z3042 Encounter for surveillance of injectable contraceptive: Secondary | ICD-10-CM

## 2016-12-31 MED ORDER — MEDROXYPROGESTERONE ACETATE 150 MG/ML IM SUSP
150.0000 mg | Freq: Once | INTRAMUSCULAR | Status: AC
  Administered 2016-12-31: 150 mg via INTRAMUSCULAR

## 2016-12-31 NOTE — Progress Notes (Signed)
Patient is here for Depo Provera Injection Patient is within Depo Provera Calender Limits yes, 12/18-1/1  Next Depo Due between: 3/6-3/20 Last AEX: 01/11/16 AEX Scheduled: needs to schedule   Patient is aware when next depo is due  Pt tolerated Injection well RUOQ.  Routed to provider for review, encounter closed.

## 2017-03-19 ENCOUNTER — Encounter: Payer: Self-pay | Admitting: Certified Nurse Midwife

## 2017-03-19 ENCOUNTER — Other Ambulatory Visit: Payer: Self-pay

## 2017-03-19 ENCOUNTER — Ambulatory Visit: Payer: BC Managed Care – PPO | Admitting: Certified Nurse Midwife

## 2017-03-19 VITALS — BP 108/60 | HR 64 | Resp 16 | Ht 67.25 in | Wt 178.0 lb

## 2017-03-19 DIAGNOSIS — R635 Abnormal weight gain: Secondary | ICD-10-CM

## 2017-03-19 DIAGNOSIS — F418 Other specified anxiety disorders: Secondary | ICD-10-CM

## 2017-03-19 DIAGNOSIS — Z01419 Encounter for gynecological examination (general) (routine) without abnormal findings: Secondary | ICD-10-CM | POA: Diagnosis not present

## 2017-03-19 DIAGNOSIS — E559 Vitamin D deficiency, unspecified: Secondary | ICD-10-CM

## 2017-03-19 DIAGNOSIS — Z3042 Encounter for surveillance of injectable contraceptive: Secondary | ICD-10-CM | POA: Diagnosis not present

## 2017-03-19 DIAGNOSIS — Z0000 Encounter for general adult medical examination without abnormal findings: Secondary | ICD-10-CM | POA: Diagnosis not present

## 2017-03-19 MED ORDER — MEDROXYPROGESTERONE ACETATE 150 MG/ML IM SUSP
150.0000 mg | Freq: Once | INTRAMUSCULAR | Status: AC
  Administered 2017-03-19: 150 mg via INTRAMUSCULAR

## 2017-03-19 MED ORDER — FLUOXETINE HCL 10 MG PO TABS: 10.0000 mg | ORAL_TABLET | Freq: Every day | ORAL | 3 refills | Status: DC

## 2017-03-19 NOTE — Progress Notes (Signed)
Patient is here for Depo Provera Injection Patient is within Depo Provera Calender Limits yes, last depo 12/31/16  Next Depo Due between: 5/23- 6/6  Last AEX: Today AEX Scheduled: not currently scheduled  Patient is aware when next depo is due  Pt tolerated Injection well in LUOQ.  Routed to provider for review, encounter closed.

## 2017-03-19 NOTE — Progress Notes (Signed)
37 y.o. G0P0000 Single  Caucasian Fe here for annual exam. Periods none. Depo Provera working well for contraception. Denies spotting. Working out 6 days a week with trainer, has been monitoring diet, with increase protein and decrease in fat and carbohydrates. Has developed muscle, but no weight loss. Sees PCP for Inderal. Anxiety much better with Prozac use and desires continuance.  Has gained weight with the last 3 Depo Provera  Injections. Not sexually active, using Depo for Dysmenorrhea and menorrhagia. Patient concerned also. No other health issues today.   No LMP recorded. Patient has had an injection.          Sexually active: No.  The current method of family planning is Depo-Provera injections.    Exercising: Yes.    cardio, strength, personal trainer Smoker:  no  Health Maintenance: Pap:  11-18-12 neg HPV HR neg, 01-11-16 neg , no endos History of Abnormal Pap: no MMG:  none Self Breast exams: no Colonoscopy:  none BMD:   none TDaP:  2017 Shingles: no Pneumonia: no Hep C and HIV: not done Labs: yes   reports that  has never smoked. she has never used smokeless tobacco. She reports that she does not drink alcohol or use drugs.  Past Medical History:  Diagnosis Date  . ADHD (attention deficit hyperactivity disorder)   . Frequent UTI   . IC (interstitial cystitis)   . On Depo-Provera for contraception 08/2002    Past Surgical History:  Procedure Laterality Date  . TYMPANOSTOMY TUBE PLACEMENT Bilateral 03/2013  . WISDOM TOOTH EXTRACTION      Current Outpatient Medications  Medication Sig Dispense Refill  . CALCIUM PO Take by mouth.    . Cholecalciferol (VITAMIN D PO) Take by mouth daily.    Marland Kitchen FLUoxetine (PROZAC) 10 MG tablet Take 1 tablet (10 mg total) by mouth daily. 90 tablet 3  . IRON PO Take by mouth daily.    . medroxyPROGESTERone (DEPO-PROVERA) 150 MG/ML injection Inject 1 mL (150 mg total) into the muscle every 3 (three) months. 1 mL 4  . propranolol (INDERAL)  10 MG tablet Take 1 tablet (10 mg total) by mouth daily as needed. One hour prior to appointment 30 tablet 3   No current facility-administered medications for this visit.     Family History  Problem Relation Age of Onset  . Hypertension Mother   . Celiac disease Father   . Diabetes Paternal Uncle   . Drug abuse Paternal Uncle        heroin  . Cancer Maternal Grandmother        gland behind the ear    ROS:  Pertinent items are noted in HPI.  Otherwise, a comprehensive ROS was negative.  Exam:   BP 108/60   Pulse 64   Resp 16   Ht 5' 7.25" (1.708 m)   Wt 178 lb (80.7 kg)   BMI 27.67 kg/m  Height: 5' 7.25" (170.8 cm) Ht Readings from Last 3 Encounters:  03/19/17 5' 7.25" (1.708 m)  10/14/16 5\' 7"  (1.702 m)  07/29/16 5\' 7"  (1.702 m)    General appearance: alert, cooperative and appears stated age Head: Normocephalic, without obvious abnormality, atraumatic Neck: no adenopathy, supple, symmetrical, trachea midline and thyroid normal to inspection and palpation Lungs: clear to auscultation bilaterally Breasts: normal appearance, no masses or tenderness, No nipple retraction or dimpling, No nipple discharge or bleeding, No axillary or supraclavicular adenopathy Heart: regular rate and rhythm Abdomen: soft, non-tender; no masses,  no organomegaly  Extremities: extremities normal, atraumatic, no cyanosis or edema Skin: Skin color, texture, turgor normal. No rashes or lesions Lymph nodes: Cervical, supraclavicular, and axillary nodes normal. No abnormal inguinal nodes palpated Neurologic: Grossly normal   Pelvic: External genitalia:  no lesions              Urethra:  normal appearing urethra with no masses, tenderness or lesions              Bartholin's and Skene's: normal                 Vagina: normal appearing vagina with normal color and discharge, no lesions              Cervix: no cervical motion tenderness, no lesions and nulliparous appearance              Pap taken:  No. Bimanual Exam:  Uterus:  normal size, contour, position, consistency, mobility, non-tender              Adnexa: normal adnexa and no mass, fullness, tenderness               Rectovaginal: Confirms               Anus:  normal sphincter tone, no lesions  Chaperone present: yes  A:  Well Woman with normal exam  Contraception Depo Provera due today  Weight gain over the past 9 months  Anxiety/depression with Prozac use working well  PCP management of Inderal for tachycardia episodes  Screening labs  P:   Reviewed health and wellness   pertinent to exam  Discussed risks/benefits/warning signs and weight gain with Depp Provera. Discussed other options with Progesterone only use, which she worked better for her. Interested in IUD/Nexplanon. Discussed risks/benefits/warning signs and bleeding expectations. Given printed information and insurance information to check on. Will do Depo Provera today and patient will advise if decides to change.   Discussed weight management with her exercise and she is being proactive. Discussed decrease protein intake and increase vegetables and low sugar fruits for variety and weight change. Questions addressed.  Discussed risks/benefits/warning signs with Prozac use. Requests continuance  Rx Prozac see order with instructions  Labs: TSH with panel, Lipid panel, Vitamin D  Pap smear: no   counseled on breast self exam, STD prevention, HIV risk factors and prevention, family planning choices, adequate intake of calcium and vitamin D, diet and exercise  return annually or prn  An After Visit Summary was printed and given to the patient.

## 2017-03-19 NOTE — Patient Instructions (Signed)
General topics  Next pap or exam is  due in 1 year Take a Women's multivitamin Take 1200 mg. of calcium daily - prefer dietary If any concerns in interim to call back  Breast Self-Awareness Practicing breast self-awareness may pick up problems early, prevent significant medical complications, and possibly save your life. By practicing breast self-awareness, you can become familiar with how your breasts look and feel and if your breasts are changing. This allows you to notice changes early. It can also offer you some reassurance that your breast health is good. One way to learn what is normal for your breasts and whether your breasts are changing is to do a breast self-exam. If you find a lump or something that was not present in the past, it is best to contact your caregiver right away. Other findings that should be evaluated by your caregiver include nipple discharge, especially if it is bloody; skin changes or reddening; areas where the skin seems to be pulled in (retracted); or new lumps and bumps. Breast pain is seldom associated with cancer (malignancy), but should also be evaluated by a caregiver. BREAST SELF-EXAM The best time to examine your breasts is 5 7 days after your menstrual period is over.  ExitCare Patient Information 2013 [place].   Exercise to Stay Healthy Exercise helps you become and stay healthy. EXERCISE IDEAS AND TIPS Choose exercises that:  You enjoy.  Fit into your day. You do not need to exercise really hard to be healthy. You can do exercises at a slow or medium level and stay healthy. You can:  Stretch before and after working out.  Try yoga, Pilates, or tai chi.  Lift weights.  Walk fast, swim, jog, run, climb stairs, bicycle, dance, or rollerskate.  Take aerobic classes. Exercises that burn about 150 calories:  Running 1  miles in 15 minutes.  Playing volleyball for 45 to 60 minutes.  Washing and waxing a car for 45 to 60  minutes.  Playing touch football for 45 minutes.  Walking 1  miles in 35 minutes.  Pushing a stroller 1  miles in 30 minutes.  Playing basketball for 30 minutes.  Raking leaves for 30 minutes.  Bicycling 5 miles in 30 minutes.  Walking 2 miles in 30 minutes.  Dancing for 30 minutes.  Shoveling snow for 15 minutes.  Swimming laps for 20 minutes.  Walking up stairs for 15 minutes.  Bicycling 4 miles in 15 minutes.  Gardening for 30 to 45 minutes.  Jumping rope for 15 minutes.  Washing windows or floors for 45 to 60 minutes. Document Released: 02/01/2010 Document Revised: 03/24/2011 Document Reviewed: 02/01/2010 [facility] Patient Information 2013 [place].   Other topics ( that may be useful information):    Sexually Transmitted Disease Sexually transmitted disease (STD) refers to any infection that is passed from person to person during sexual activity. This may happen by way of saliva, semen, blood, vaginal mucus, or urine. Common STDs include:  Gonorrhea.  Chlamydia.  Syphilis.  HIV/AIDS.  Genital herpes.  Hepatitis B and C.  Trichomonas.  Human papillomavirus (HPV).  Pubic lice. CAUSES  An STD may be spread by bacteria, virus, or parasite. A person can get an STD by:  Sexual intercourse with an infected person.  Sharing sex toys with an infected person.  Sharing needles with an infected person.  Having intimate contact with the genitals, mouth, or rectal areas of an infected person. SYMPTOMS  Some people may not have any symptoms, but  they can still pass the infection to others. Different STDs have different symptoms. Symptoms include:  Painful or bloody urination.  Pain in the pelvis, abdomen, vagina, anus, throat, or eyes.  Skin rash, itching, irritation, growths, or sores (lesions). These usually occur in the genital or anal area.  Abnormal vaginal discharge.  Penile discharge in men.  Soft, flesh-colored skin growths in the  genital or anal area.  Fever.  Pain or bleeding during sexual intercourse.  Swollen glands in the groin area.  Yellow skin and eyes (jaundice). This is seen with hepatitis. DIAGNOSIS  To make a diagnosis, your caregiver may:  Take a medical history.  Perform a physical exam.  Take a specimen (culture) to be examined.  Examine a sample of discharge under a microscope.  Perform blood test TREATMENT   Chlamydia, gonorrhea, trichomonas, and syphilis can be cured with antibiotic medicine.  Genital herpes, hepatitis, and HIV can be treated, but not cured, with prescribed medicines. The medicines will lessen the symptoms.  Genital warts from HPV can be treated with medicine or by freezing, burning (electrocautery), or surgery. Warts may come back.  HPV is a virus and cannot be cured with medicine or surgery.However, abnormal areas may be followed very closely by your caregiver and may be removed from the cervix, vagina, or vulva through office procedures or surgery. If your diagnosis is confirmed, your recent sexual partners need treatment. This is true even if they are symptom-free or have a negative culture or evaluation. They should not have sex until their caregiver says it is okay. HOME CARE INSTRUCTIONS  All sexual partners should be informed, tested, and treated for all STDs.  Take your antibiotics as directed. Finish them even if you start to feel better.  Only take over-the-counter or prescription medicines for pain, discomfort, or fever as directed by your caregiver.  Rest.  Eat a balanced diet and drink enough fluids to keep your urine clear or pale yellow.  Do not have sex until treatment is completed and you have followed up with your caregiver. STDs should be checked after treatment.  Keep all follow-up appointments, Pap tests, and blood tests as directed by your caregiver.  Only use latex condoms and water-soluble lubricants during sexual activity. Do not use  petroleum jelly or oils.  Avoid alcohol and illegal drugs.  Get vaccinated for HPV and hepatitis. If you have not received these vaccines in the past, talk to your caregiver about whether one or both might be right for you.  Avoid risky sex practices that can break the skin. The only way to avoid getting an STD is to avoid all sexual activity.Latex condoms and dental dams (for oral sex) will help lessen the risk of getting an STD, but will not completely eliminate the risk. SEEK MEDICAL CARE IF:   You have a fever.  You have any new or worsening symptoms. Document Released: 03/22/2002 Document Revised: 03/24/2011 Document Reviewed: 03/29/2010 [facility] Patient Information 2013 [place].    Domestic Abuse You are being battered or abused if someone close to you hits, pushes, or physically hurts you in any way. You also are being abused if you are forced into activities. You are being sexually abused if you are forced to have sexual contact of any kind. You are being emotionally abused if you are made to feel worthless or if you are constantly threatened. It is important to remember that help is available. No one has the right to abuse you. PREVENTION OF FURTHER  ABUSE  Learn the warning signs of danger. This varies with situations but may include: the use of alcohol, threats, isolation from friends and family, or forced sexual contact. Leave if you feel that violence is going to occur.  If you are attacked or beaten, report it to the police so the abuse is documented. You do not have to press charges. The police can protect you while you or the attackers are leaving. Get the officer's name and badge number and a copy of the report.  Find someone you can trust and tell them what is happening to you: your caregiver, a nurse, clergy member, close friend or family member. Feeling ashamed is natural, but remember that you have done nothing wrong. No one deserves abuse. Document Released:  12/28/1999 Document Revised: 03/24/2011 Document Reviewed: 03/07/2010 Exit[employer] Patient Information 2013 [employer].    How Much is Too Much Alcohol? Drinking too much alcohol can cause injury, accidents, and health problems. These types of problems can include:   Car crashes.  Falls.  Family fighting (domestic violence).  Drowning.  Fights.  Injuries.  Burns.  Damage to certain organs.  Having a baby with birth defects. ONE DRINK CAN BE TOO MUCH WHEN YOU ARE:  Working.  Pregnant or breastfeeding.  Taking medicines. Ask your doctor.  Driving or planning to drive. If you or someone you know has a drinking problem, get help from a doctor.  Document Released: 10/26/2008 Document Revised: 03/24/2011 Document Reviewed: 10/26/2008 ExitCare Patient Information 2013 [employer].   Smoking Hazards Smoking cigarettes is extremely bad for your health. Tobacco smoke has over 200 known poisons in it. There are over 60 chemicals in tobacco smoke that cause cancer. Some of the chemicals found in cigarette smoke include:   Cyanide.  Benzene.  Formaldehyde.  Methanol (wood alcohol).  Acetylene (fuel used in welding torches).  Ammonia. Cigarette smoke also contains the poisonous gases nitrogen oxide and carbon monoxide.  Cigarette smokers have an increased risk of many serious medical problems and Smoking causes approximately:  90% of all lung cancer deaths in men.  80% of all lung cancer deaths in women.  90% of deaths from chronic obstructive lung disease. Compared with nonsmokers, smoking increases the risk of:  Coronary heart disease by 2 to 4 times.  Stroke by 2 to 4 times.  Men developing lung cancer by 23 times.  Women developing lung cancer by 13 times.  Dying from chronic obstructive lung diseases by 12 times.  . Smoking is the most preventable cause of death and disease in our society.  WHY IS SMOKING ADDICTIVE?  Nicotine is the chemical  agent in tobacco that is capable of causing addiction or dependence.  When you smoke and inhale, nicotine is absorbed rapidly into the bloodstream through your lungs. Nicotine absorbed through the lungs is capable of creating a powerful addiction. Both inhaled and non-inhaled nicotine may be addictive.  Addiction studies of cigarettes and spit tobacco show that addiction to nicotine occurs mainly during the teen years, when young people begin using tobacco products. WHAT ARE THE BENEFITS OF QUITTING?  There are many health benefits to quitting smoking.   Likelihood of developing cancer and heart disease decreases. Health improvements are seen almost immediately.  Blood pressure, pulse rate, and breathing patterns start returning to normal soon after quitting. QUITTING SMOKING   American Lung Association - 1-800-LUNGUSA  American Cancer Society - 1-800-ACS-2345 Document Released: 02/07/2004 Document Revised: 03/24/2011 Document Reviewed: 10/11/2008 ExitCare Patient Information 2013 [employer].   Stress Management Stress is a state of physical or mental tension that often results from changes in your life or normal routine. Some common causes of stress are:  Death of a loved one.  Injuries or severe illnesses.  Getting fired or changing jobs.  Moving into a new home. Other causes may be:  Sexual problems.  Business or financial losses.  Taking on a large debt.  Regular conflict with someone at home or at work.  Constant tiredness from lack of sleep. It is not just bad things that are stressful. It may be stressful to:  Win the lottery.  Get married.  Buy a new car. The amount of stress that can be easily tolerated varies from person to person. Changes generally cause stress, regardless of the types of change. Too much stress can affect your health. It may lead to physical or emotional problems. Too little stress (boredom) may also become stressful. SUGGESTIONS TO  REDUCE STRESS:  Talk things over with your family and friends. It often is helpful to share your concerns and worries. If you feel your problem is serious, you may want to get help from a professional counselor.  Consider your problems one at a time instead of lumping them all together. Trying to take care of everything at once may seem impossible. List all the things you need to do and then start with the most important one. Set a goal to accomplish 2 or 3 things each day. If you expect to do too many in a single day you will naturally fail, causing you to feel even more stressed.  Do not use alcohol or drugs to relieve stress. Although you may feel better for a short time, they do not remove the problems that caused the stress. They can also be habit forming.  Exercise regularly - at least 3 times per week. Physical exercise can help to relieve that "uptight" feeling and will relax you.  The shortest distance between despair and hope is often a good night's sleep.  Go to bed and get up on time allowing yourself time for appointments without being rushed.  Take a short "time-out" period from any stressful situation that occurs during the day. Close your eyes and take some deep breaths. Starting with the muscles in your face, tense them, hold it for a few seconds, then relax. Repeat this with the muscles in your neck, shoulders, hand, stomach, back and legs.  Take good care of yourself. Eat a balanced diet and get plenty of rest.  Schedule time for having fun. Take a break from your daily routine to relax. HOME CARE INSTRUCTIONS   Call if you feel overwhelmed by your problems and feel you can no longer manage them on your own.  Return immediately if you feel like hurting yourself or someone else. Document Released: 06/25/2000 Document Revised: 03/24/2011 Document Reviewed: 02/15/2007 [facility] Patient Information 2013 [place].   Calorie Counting for Weight Loss Calories are units  of energy. Your body needs a certain amount of calories from food to keep you going throughout the day. When you eat more calories than your body needs, your body stores the extra calories as fat. When you eat fewer calories than your body needs, your body burns fat to get the energy it needs. Calorie counting means keeping track of how many calories you eat and drink each day. Calorie counting can be helpful if you need to lose weight. If you make sure to eat fewer  calories than your body needs, you should lose weight. Ask your health care provider what a healthy weight is for you. For calorie counting to work, you will need to eat the right number of calories in a day in order to lose a healthy amount of weight per week. A dietitian can help you determine how many calories you need in a day and will give you suggestions on how to reach your calorie goal.  A healthy amount of weight to lose per week is usually 1-2 lb (0.5-0.9 kg). This usually means that your daily calorie intake should be reduced by 500-750 calories.  Eating 1,200 - 1,500 calories per day can help most women lose weight.  Eating 1,500 - 1,800 calories per day can help most men lose weight.  What is my plan? My goal is to have __________ calories per day. If I have this many calories per day, I should lose around __________ pounds per week. What do I need to know about calorie counting? In order to meet your daily calorie goal, you will need to:  Find out how many calories are in each food you would like to eat. Try to do this before you eat.  Decide how much of the food you plan to eat.  Write down what you ate and how many calories it had. Doing this is called keeping a food log.  To successfully lose weight, it is important to balance calorie counting with a healthy lifestyle that includes regular activity. Aim for 150 minutes of moderate exercise (such as walking) or 75 minutes of vigorous exercise (such as running) each  week. Where do I find calorie information?  The number of calories in a food can be found on a Nutrition Facts label. If a food does not have a Nutrition Facts label, try to look up the calories online or ask your dietitian for help. Remember that calories are listed per serving. If you choose to have more than one serving of a food, you will have to multiply the calories per serving by the amount of servings you plan to eat. For example, the label on a package of bread might say that a serving size is 1 slice and that there are 90 calories in a serving. If you eat 1 slice, you will have eaten 90 calories. If you eat 2 slices, you will have eaten 180 calories. How do I keep a food log? Immediately after each meal, record the following information in your food log:  What you ate. Don't forget to include toppings, sauces, and other extras on the food.  How much you ate. This can be measured in cups, ounces, or number of items.  How many calories each food and drink had.  The total number of calories in the meal.  Keep your food log near you, such as in a small notebook in your pocket, or use a mobile app or website. Some programs will calculate calories for you and show you how many calories you have left for the day to meet your goal. What are some calorie counting tips?  Use your calories on foods and drinks that will fill you up and not leave you hungry: ? Some examples of foods that fill you up are nuts and nut butters, vegetables, lean proteins, and high-fiber foods like whole grains. High-fiber foods are foods with more than 5 g fiber per serving. ? Drinks such as sodas, specialty coffee drinks, alcohol, and juices have a lot of  calories, yet do not fill you up.  Eat nutritious foods and avoid empty calories. Empty calories are calories you get from foods or beverages that do not have many vitamins or protein, such as candy, sweets, and soda. It is better to have a nutritious high-calorie  food (such as an avocado) than a food with few nutrients (such as a bag of chips).  Know how many calories are in the foods you eat most often. This will help you calculate calorie counts faster.  Pay attention to calories in drinks. Low-calorie drinks include water and unsweetened drinks.  Pay attention to nutrition labels for "low fat" or "fat free" foods. These foods sometimes have the same amount of calories or more calories than the full fat versions. They also often have added sugar, starch, or salt, to make up for flavor that was removed with the fat.  Find a way of tracking calories that works for you. Get creative. Try different apps or programs if writing down calories does not work for you. What are some portion control tips?  Know how many calories are in a serving. This will help you know how many servings of a certain food you can have.  Use a measuring cup to measure serving sizes. You could also try weighing out portions on a kitchen scale. With time, you will be able to estimate serving sizes for some foods.  Take some time to put servings of different foods on your favorite plates, bowls, and cups so you know what a serving looks like.  Try not to eat straight from a bag or box. Doing this can lead to overeating. Put the amount you would like to eat in a cup or on a plate to make sure you are eating the right portion.  Use smaller plates, glasses, and bowls to prevent overeating.  Try not to multitask (for example, watch TV or use your computer) while eating. If it is time to eat, sit down at a table and enjoy your food. This will help you to know when you are full. It will also help you to be aware of what you are eating and how much you are eating. What are tips for following this plan? Reading food labels  Check the calorie count compared to the serving size. The serving size may be smaller than what you are used to eating.  Check the source of the calories. Make sure  the food you are eating is high in vitamins and protein and low in saturated and trans fats. Shopping  Read nutrition labels while you shop. This will help you make healthy decisions before you decide to purchase your food.  Make a grocery list and stick to it. Cooking  Try to cook your favorite foods in a healthier way. For example, try baking instead of frying.  Use low-fat dairy products. Meal planning  Use more fruits and vegetables. Half of your plate should be fruits and vegetables.  Include lean proteins like poultry and fish. How do I count calories when eating out?  Ask for smaller portion sizes.  Consider sharing an entree and sides instead of getting your own entree.  If you get your own entree, eat only half. Ask for a box at the beginning of your meal and put the rest of your entree in it so you are not tempted to eat it.  If calories are listed on the menu, choose the lower calorie options.  Choose dishes that include vegetables, fruits,   whole grains, low-fat dairy products, and lean protein.  Choose items that are boiled, broiled, grilled, or steamed. Stay away from items that are buttered, battered, fried, or served with cream sauce. Items labeled "crispy" are usually fried, unless stated otherwise.  Choose water, low-fat milk, unsweetened iced tea, or other drinks without added sugar. If you want an alcoholic beverage, choose a lower calorie option such as a glass of wine or light beer.  Ask for dressings, sauces, and syrups on the side. These are usually high in calories, so you should limit the amount you eat.  If you want a salad, choose a garden salad and ask for grilled meats. Avoid extra toppings like bacon, cheese, or fried items. Ask for the dressing on the side, or ask for olive oil and vinegar or lemon to use as dressing.  Estimate how many servings of a food you are given. For example, a serving of cooked rice is  cup or about the size of half a  baseball. Knowing serving sizes will help you be aware of how much food you are eating at restaurants. The list below tells you how big or small some common portion sizes are based on everyday objects: ? 1 oz-4 stacked dice. ? 3 oz-1 deck of cards. ? 1 tsp-1 die. ? 1 Tbsp- a ping-pong ball. ? 2 Tbsp-1 ping-pong ball. ?  cup- baseball. ? 1 cup-1 baseball. Summary  Calorie counting means keeping track of how many calories you eat and drink each day. If you eat fewer calories than your body needs, you should lose weight.  A healthy amount of weight to lose per week is usually 1-2 lb (0.5-0.9 kg). This usually means reducing your daily calorie intake by 500-750 calories.  The number of calories in a food can be found on a Nutrition Facts label. If a food does not have a Nutrition Facts label, try to look up the calories online or ask your dietitian for help.  Use your calories on foods and drinks that will fill you up, and not on foods and drinks that will leave you hungry.  Use smaller plates, glasses, and bowls to prevent overeating. This information is not intended to replace advice given to you by your health care provider. Make sure you discuss any questions you have with your health care provider. Document Released: 12/30/2004 Document Revised: 11/30/2015 Document Reviewed: 11/30/2015 Elsevier Interactive Patient Education  [employer].

## 2017-03-20 ENCOUNTER — Telehealth: Payer: Self-pay

## 2017-03-20 LAB — LIPID PANEL
Chol/HDL Ratio: 5 ratio — ABNORMAL HIGH (ref 0.0–4.4)
Cholesterol, Total: 171 mg/dL (ref 100–199)
HDL: 34 mg/dL — AB (ref >39)
LDL Calculated: 111 mg/dL — ABNORMAL HIGH (ref 0–99)
Triglycerides: 130 mg/dL (ref 0–149)
VLDL Cholesterol Cal: 26 mg/dL (ref 5–40)

## 2017-03-20 LAB — THYROID PANEL WITH TSH
FREE THYROXINE INDEX: 1.3 (ref 1.2–4.9)
T3 UPTAKE RATIO: 23 % — AB (ref 24–39)
T4, Total: 5.5 ug/dL (ref 4.5–12.0)
TSH: 1.11 u[IU]/mL (ref 0.450–4.500)

## 2017-03-20 LAB — VITAMIN D 25 HYDROXY (VIT D DEFICIENCY, FRACTURES): VIT D 25 HYDROXY: 27 ng/mL — AB (ref 30.0–100.0)

## 2017-03-20 NOTE — Telephone Encounter (Signed)
Spoke with patient. Results given. Patient verbalizes understanding. Patient is scheduled to see her PCP on 03/24/2017 and will discuss labs at that time. Labs faxed to Dr.Fry. Encounter closed.  Notes recorded by Verner CholLeonard, Deborah S, CNM on 03/20/2017 at 7:56 AM EST Notify patient that her Vitamin D is low at 27, needs to start on 1000 IU Vitamin D 3 daily this should bring to normal range and maintain Her TSH is normal and slight decrease in T3 only T4 normal Lipid panel showed low HDL protective cholesterol hers 34 normal >39 and high LDL cholesterol at 111 Normal <99 This is a reflection of weight gain and diet also. We discussed diet/her exercise pattern and over use of protein supplements. She has PCP and needs to follow up with them regarding cholesterol and thyroid. She will need copy of labs.

## 2017-03-24 ENCOUNTER — Ambulatory Visit: Payer: BC Managed Care – PPO | Admitting: Family Medicine

## 2017-03-24 ENCOUNTER — Encounter: Payer: Self-pay | Admitting: Family Medicine

## 2017-03-24 VITALS — BP 118/78 | HR 68 | Temp 98.3°F | Wt 178.4 lb

## 2017-03-24 DIAGNOSIS — R000 Tachycardia, unspecified: Secondary | ICD-10-CM

## 2017-03-24 MED ORDER — PROPRANOLOL HCL 10 MG PO TABS: 10.0000 mg | ORAL_TABLET | Freq: Every day | ORAL | 5 refills | Status: DC | PRN

## 2017-03-24 NOTE — Progress Notes (Signed)
   Subjective:    Patient ID: Misty Sanchez, female    DOB: Jun 14, 1980, 37 y.o.   MRN: [id number]  HPI Here for refills on Propranolol. She uses this to prevent rapid heart rates when she donates plasma. She donates about twice a week. She feels fine in general. She works out ot her gym 5-6 days a week.    Review of Systems  Constitutional: Negative.   Respiratory: Negative.   Cardiovascular: Negative.   Neurological: Negative.        Objective:   Physical Exam  Constitutional: She is oriented to person, place, and time. She appears well-developed and well-nourished.  Neck: No thyromegaly present.  Cardiovascular: Normal rate, regular rhythm, normal heart sounds and intact distal pulses.  Pulmonary/Chest: Effort normal and breath sounds normal. No respiratory distress. She has no wheezes. She has no rales.  Lymphadenopathy:    She has no cervical adenopathy.  Neurological: She is alert and oriented to person, place, and time.          Assessment & Plan:  Situational tachycardia, stable. Meds were refilled.  Gershon CraneStephen Fry, MD

## 2017-04-02 ENCOUNTER — Telehealth: Payer: Self-pay | Admitting: Certified Nurse Midwife

## 2017-04-02 DIAGNOSIS — Z30430 Encounter for insertion of intrauterine contraceptive device: Secondary | ICD-10-CM

## 2017-04-02 NOTE — Telephone Encounter (Signed)
Left message to convey benefits. °

## 2017-04-02 NOTE — Telephone Encounter (Signed)
Last Depo Provera injection given 03/19/2017. Next due 5/23-6/6. Patient has decided she would like to switch to using IUD. Routing to Dr.Jertson for review. Is it better for the patient to wait until the time frame where she would be due for her Depo to have IUD insertion or okay to do at any time?

## 2017-04-02 NOTE — Telephone Encounter (Signed)
She can have the IUD placed any time prior to when she is due for her next depo-shot.

## 2017-04-02 NOTE — Telephone Encounter (Signed)
Spoke with patient. Appointment for IUD insertion scheduled for 04/13/2017 at 1 pm with Dr.Jertson. Pre procedure instructions given.  Motrin instructions given. Motrin=Advil=Ibuprofen, 800 mg one hour before appointment. Eat a meal and hydrate well before appointment. Order placed.  Routing to provider for final review. Patient agreeable to disposition. Will close encounter.

## 2017-04-02 NOTE — Telephone Encounter (Signed)
Left message to call Meet Weathington at [phone]. 

## 2017-04-02 NOTE — Telephone Encounter (Signed)
Patient is ready to schedule her IUD insertion.  °

## 2017-04-03 ENCOUNTER — Telehealth: Payer: Self-pay | Admitting: Certified Nurse Midwife

## 2017-04-03 NOTE — Telephone Encounter (Signed)
Per pharmacy last rx written by Ria CommentPatricia Grubb, FNP was last picked up 01-26-17 It was a 90 day supply. A new rx was written by Leota Sauersdeborah leonard same dosage for 03-19-17. Per pharmacy that rx has been put on hold & she cant pick that rx up yet because its too early. Pt has to wait until 4/19

## 2017-04-03 NOTE — Telephone Encounter (Signed)
Return call to Joy. °

## 2017-04-03 NOTE — Telephone Encounter (Signed)
Left message to call back  

## 2017-04-03 NOTE — Telephone Encounter (Signed)
rx was for prozac 10mg . Pt aware she can pick up rx at pharmacy after 04-26-17

## 2017-04-03 NOTE — Telephone Encounter (Signed)
Patient waited too long to pick up prescription and the pharmacy needs a new one.

## 2017-04-13 ENCOUNTER — Ambulatory Visit (INDEPENDENT_AMBULATORY_CARE_PROVIDER_SITE_OTHER): Payer: BC Managed Care – PPO | Admitting: Obstetrics and Gynecology

## 2017-04-13 ENCOUNTER — Encounter: Payer: Self-pay | Admitting: Obstetrics and Gynecology

## 2017-04-13 ENCOUNTER — Other Ambulatory Visit: Payer: Self-pay

## 2017-04-13 VITALS — BP 110/70 | HR 72 | Resp 14 | Wt 181.0 lb

## 2017-04-13 DIAGNOSIS — Z01812 Encounter for preprocedural laboratory examination: Secondary | ICD-10-CM | POA: Diagnosis not present

## 2017-04-13 DIAGNOSIS — Z30430 Encounter for insertion of intrauterine contraceptive device: Secondary | ICD-10-CM | POA: Diagnosis not present

## 2017-04-13 LAB — POCT URINE PREGNANCY: Preg Test, Ur: NEGATIVE

## 2017-04-13 NOTE — Patient Instructions (Signed)

## 2017-04-13 NOTE — Progress Notes (Signed)
GYNECOLOGY  VISIT   HPI: 37 y.o.   Single  Caucasian  female   G0P0000 with No LMP recorded. Patient has had an injection.   here for IUD insertion. She needs contraception, has been on the depo-provera for 15 years. She has gained weight and wants to change contraception. She would like to not have cycles. Prior to the depo-provera she had terrible cramps, heavy bleeding, nausea.   No risk of STD's   GYNECOLOGIC HISTORY: No LMP recorded. Patient has had an injection. Contraception:Depo- Provera Menopausal hormone therapy: none         OB History    Gravida  0   Para  0   Term  0   Preterm  0   AB  0   Living  0     SAB  0   TAB  0   Ectopic  0   Multiple  0   Live Births  0              Patient Active Problem List   Diagnosis Date Noted  . Tachycardia 03/24/2017  . Dysmenorrhea 05/16/2013    Class: History of  . UTI'S, CHRONIC 08/12/2006  . ADHD 07/29/2006    Past Medical History:  Diagnosis Date  . ADHD (attention deficit hyperactivity disorder)   . Frequent UTI   . IC (interstitial cystitis)   . On Depo-Provera for contraception 08/2002    Past Surgical History:  Procedure Laterality Date  . TYMPANOSTOMY TUBE PLACEMENT Bilateral 03/2013  . WISDOM TOOTH EXTRACTION      Current Outpatient Medications  Medication Sig Dispense Refill  . CALCIUM PO Take by mouth.    . Cholecalciferol (VITAMIN D PO) Take by mouth daily.    Marland Kitchen FLUoxetine (PROZAC) 10 MG tablet Take 1 tablet (10 mg total) by mouth daily. 90 tablet 3  . IRON PO Take by mouth daily.    . medroxyPROGESTERone (DEPO-PROVERA) 150 MG/ML injection Inject 1 mL (150 mg total) into the muscle every 3 (three) months. 1 mL 4  . propranolol (INDERAL) 10 MG tablet Take 1 tablet (10 mg total) by mouth daily as needed. One hour prior to appointment 30 tablet 5   No current facility-administered medications for this visit.      ALLERGIES: Azithromycin  Family History  Problem Relation Age of Onset   . Hypertension Mother   . Celiac disease Father   . Diabetes Paternal Uncle   . Drug abuse Paternal Uncle        heroin  . Cancer Maternal Grandmother        gland behind the ear    Social History   Socioeconomic History  . Marital status: Single    Spouse name: Not on file  . Number of children: Not on file  . Years of education: Not on file  . Highest education level: Not on file  Occupational History  . Not on file  Social Needs  . Financial resource strain: Not on file  . Food insecurity:    Worry: Not on file    Inability: Not on file  . Transportation needs:    Medical: Not on file    Non-medical: Not on file  Tobacco Use  . Smoking status: Never Smoker  . Smokeless tobacco: Never Used  Substance and Sexual Activity  . Alcohol use: No  . Drug use: No  . Sexual activity: Never    Partners: Male    Birth control/protection: Injection  Comment: depo provera 150mg   Lifestyle  . Physical activity:    Days per week: Not on file    Minutes per session: Not on file  . Stress: Not on file  Relationships  . Social connections:    Talks on phone: Not on file    Gets together: Not on file    Attends religious service: Not on file    Active member of club or organization: Not on file    Attends meetings of clubs or organizations: Not on file    Relationship status: Not on file  . Intimate partner violence:    Fear of current or ex partner: Not on file    Emotionally abused: Not on file    Physically abused: Not on file    Forced sexual activity: Not on file  Other Topics Concern  . Not on file  Social History Narrative  . Not on file    Review of Systems  Constitutional: Negative.   HENT: Negative.   Eyes: Negative.   Respiratory: Negative.   Cardiovascular: Negative.   Gastrointestinal: Negative.   Genitourinary: Negative.   Musculoskeletal: Negative.   Skin: Negative.   Neurological: Negative.   Endo/Heme/Allergies: Negative.    Psychiatric/Behavioral: Negative.     PHYSICAL EXAMINATION:    BP 110/70 (BP Location: Right Arm, Patient Position: Sitting, Cuff Size: Normal)   Pulse 72   Resp 14   Wt 181 lb (82.1 kg)   BMI 28.14 kg/m     General appearance: alert, cooperative and appears stated age  Pelvic: External genitalia:  no lesions              Urethra:  normal appearing urethra with no masses, tenderness or lesions              Bartholins and Skenes: normal                 Vagina: normal appearing vagina with normal color and discharge, no lesions              Cervix: no lesions  Bimanual: uterus is anteverted, normal sized, mobile, not tender  The risks of the mirena IUD were reviewed with the patient, including infection, abnormal bleeding and uterine perfortion. Consent was signed.  A speculum was placed in the vagina, the cervix was cleansed with betadine. A tenaculum was placed on the cervix. The cervix was dilated from a mini-dilator to a #5 hagar dilator.  the uterus sounded to 7 cm.   The mirena IUD was inserted without difficulty. The string were cut to 4 cm. The tenaculum was removed. Slight oozing from the tenaculum site was stopped with pressure.   The patient tolerated the procedure well.   Chaperone was present for exam.  ASSESSMENT Desires change from depo-provera to an IUD. Really wants to not have a cycle.  H/O heavy, crampy cycles    PLAN Discussed options for IUD's she would like the mirena Mirena IUD placed F/U in 1 month   An After Visit Summary was printed and given to the patient.  CC: Misty Sanchez, CNM

## 2017-05-15 ENCOUNTER — Encounter: Payer: Self-pay | Admitting: Obstetrics and Gynecology

## 2017-05-15 ENCOUNTER — Ambulatory Visit: Payer: BC Managed Care – PPO | Admitting: Obstetrics and Gynecology

## 2017-05-15 ENCOUNTER — Other Ambulatory Visit: Payer: Self-pay

## 2017-05-15 VITALS — BP 110/80 | HR 72 | Resp 12 | Ht 67.0 in | Wt 181.0 lb

## 2017-05-15 DIAGNOSIS — Z30431 Encounter for routine checking of intrauterine contraceptive device: Secondary | ICD-10-CM | POA: Diagnosis not present

## 2017-05-15 NOTE — Progress Notes (Signed)
GYNECOLOGY  VISIT   HPI: 37 y.o.   Single  Caucasian  female   G0P0000 with No LMP recorded. (Menstrual status: IUD).   here for   one month recheck of Mirena IUD. No bleeding. Cramping for the first few days, now fine. Not sexually active since insertion.   GYNECOLOGIC HISTORY: No LMP recorded. (Menstrual status: IUD). Contraception: Mirena IUD  Menopausal hormone therapy: none         OB History    Gravida  0   Para  0   Term  0   Preterm  0   AB  0   Living  0     SAB  0   TAB  0   Ectopic  0   Multiple  0   Live Births  0              Patient Active Problem List   Diagnosis Date Noted  . Tachycardia 03/24/2017  . Dysmenorrhea 05/16/2013    Class: History of  . UTI'S, CHRONIC 08/12/2006  . ADHD 07/29/2006    Past Medical History:  Diagnosis Date  . ADHD (attention deficit hyperactivity disorder)   . Frequent UTI   . IC (interstitial cystitis)   . On Depo-Provera for contraception 08/2002    Past Surgical History:  Procedure Laterality Date  . TYMPANOSTOMY TUBE PLACEMENT Bilateral 03/2013  . WISDOM TOOTH EXTRACTION      Current Outpatient Medications  Medication Sig Dispense Refill  . CALCIUM PO Take by mouth.    . Cholecalciferol (VITAMIN D PO) Take by mouth daily.    Marland Kitchen FLUoxetine (PROZAC) 10 MG tablet Take 1 tablet (10 mg total) by mouth daily. 90 tablet 3  . IRON PO Take by mouth daily.    . Levonorgestrel (MIRENA, 52 MG, IU) by Intrauterine route.    . propranolol (INDERAL) 10 MG tablet Take 1 tablet (10 mg total) by mouth daily as needed. One hour prior to appointment 30 tablet 5   No current facility-administered medications for this visit.      ALLERGIES: Azithromycin  Family History  Problem Relation Age of Onset  . Hypertension Mother   . Celiac disease Father   . Diabetes Paternal Uncle   . Drug abuse Paternal Uncle        heroin  . Cancer Maternal Grandmother        gland behind the ear    Social History    Socioeconomic History  . Marital status: Single    Spouse name: Not on file  . Number of children: Not on file  . Years of education: Not on file  . Highest education level: Not on file  Occupational History  . Not on file  Social Needs  . Financial resource strain: Not on file  . Food insecurity:    Worry: Not on file    Inability: Not on file  . Transportation needs:    Medical: Not on file    Non-medical: Not on file  Tobacco Use  . Smoking status: Never Smoker  . Smokeless tobacco: Never Used  Substance and Sexual Activity  . Alcohol use: No  . Drug use: No  . Sexual activity: Never    Partners: Male    Birth control/protection: IUD    Comment: Mirena IUD   Lifestyle  . Physical activity:    Days per week: Not on file    Minutes per session: Not on file  . Stress: Not on file  Relationships  . Social connections:    Talks on phone: Not on file    Gets together: Not on file    Attends religious service: Not on file    Active member of club or organization: Not on file    Attends meetings of clubs or organizations: Not on file    Relationship status: Not on file  . Intimate partner violence:    Fear of current or ex partner: Not on file    Emotionally abused: Not on file    Physically abused: Not on file    Forced sexual activity: Not on file  Other Topics Concern  . Not on file  Social History Narrative  . Not on file    Review of Systems  Constitutional: Negative.   HENT: Negative.   Eyes: Negative.   Respiratory: Negative.   Cardiovascular: Negative.   Gastrointestinal: Negative.   Genitourinary: Negative.   Musculoskeletal: Negative.   Skin: Negative.   Neurological: Negative.   Endo/Heme/Allergies: Negative.   Psychiatric/Behavioral: Negative.     PHYSICAL EXAMINATION:    BP 110/80 (BP Location: Right Arm, Patient Position: Sitting, Cuff Size: Normal)   Pulse 72   Resp 12   Ht  (1.702 m)   Wt 181 lb (82.1 kg)   BMI 28.35 kg/m      General appearance: alert, cooperative and appears stated age   Pelvic: External genitalia:  no lesions              Urethra:  normal appearing urethra with no masses, tenderness or lesions              Bartholins and Skenes: normal                 Vagina: normal appearing vagina with normal color and discharge, no lesions              Cervix: no lesions and IUD string 4 cm              Bimanual Exam:  Uterus:  normal size, contour, position, consistency, mobility, non-tender              Adnexa: no mass, fullness, tenderness               Chaperone was present for exam.  ASSESSMENT IUD check, doing well   PLAN F/U with Ms Darcel Bayley for an annual exam in 3/20   An After Visit Summary was printed and given to the patient.  Cc: Sara Chu, CNM

## 2017-06-09 ENCOUNTER — Ambulatory Visit: Payer: BC Managed Care – PPO

## 2017-08-24 ENCOUNTER — Telehealth: Payer: Self-pay | Admitting: Certified Nurse Midwife

## 2017-08-24 NOTE — Telephone Encounter (Signed)
Patient sent the following correspondence through MyChart. Routing to triage to assist patient with request.   Non-Urgent Medical Question  Misty SaxonRebecca E Sanchez  Sent: Mon August 24, 2017 11:20 AM  To: Sharol GivenP [facility]      Message   I am trying to see if you do referrals for specialists like endocrinologists? My weight issue has gotten even worse, even after swapping out my birth control, and I would like to see about seeing specialists for things that your office does not normally do.

## 2017-08-24 NOTE — Telephone Encounter (Signed)
Spoke with patient.   1. Concerned about continuous weight gain despite change from depo provera to Mirena IUD, diet changes and exercising. Reports difficulty focusing and fatigue. Patient states she has asked PCP for additional testing for thyroid and "metabolic issues", but not completed. Patient requesting a referral to a specialist to determine the cause of weight gain.   2. Patient is taking prozac 10mg  daily and vitamin D 4,000 IU daily. Advised patient per review of labs dated 03/19/17, Vit D 1,000IU daily recommended. Patient states she discussed this increased previously with Leota Sauerseborah Leonard, CNM.   Advised will review concerns and confirm Vit D with Leota Sauerseborah Leonard, CNM when she returns to office on 8/13 and return call, patient agreeable.   Leota Sauerseborah Leonard, CNM -please advise.

## 2017-08-25 ENCOUNTER — Ambulatory Visit: Payer: BC Managed Care – PPO | Admitting: Certified Nurse Midwife

## 2017-08-25 ENCOUNTER — Other Ambulatory Visit: Payer: Self-pay

## 2017-08-25 ENCOUNTER — Encounter: Payer: Self-pay | Admitting: Certified Nurse Midwife

## 2017-08-25 VITALS — BP 110/64 | HR 68 | Resp 16 | Ht 67.25 in | Wt 188.0 lb

## 2017-08-25 DIAGNOSIS — R5383 Other fatigue: Secondary | ICD-10-CM

## 2017-08-25 DIAGNOSIS — R635 Abnormal weight gain: Secondary | ICD-10-CM

## 2017-08-25 DIAGNOSIS — E559 Vitamin D deficiency, unspecified: Secondary | ICD-10-CM

## 2017-08-25 NOTE — Telephone Encounter (Signed)
Spoke with patient, advised as seen below per Leota Sauerseborah Leonard, CNM. OV scheduled for toady at 3:15pm with Leota Sauerseborah Leonard, CNM. Patient states she needs to confirm with employer ok to leave early, will return call to reschedule if needed. Patient verbalizes understanding and is agreeable. Encounter closed.

## 2017-08-25 NOTE — Telephone Encounter (Signed)
Left message to call Gissele Narducci at [phone].  

## 2017-08-25 NOTE — Progress Notes (Signed)
  Subjective:     Patient ID: Misty SaxonRebecca E Pinson, female   DOB: 1980/08/31, 37 y.o.   MRN: [id number]  [age] yo g[place] white female here to discuss weight change and increase in weight in the past 6 months since 2/18. 2/18 weight 160, 3/19 178 and 5/19 181 and today's weight is 188. She has gained 28 pounds in the last year and half. Eats vegetables, lean meat, chicken baked or grilled, tuna. Does have french fries and MM's candies daily, quarter cup. Drinks water for the most part, some green tea and skim milk for beverages. Carbonation causes GI distress.  Patient works out 3 evenings a week and work out daily at home 15 minutes, during work week and longer on weekend. Continues to take Vitamin D3 4000 IU daily to help with deficiency.     Review of Systems  Constitutional: Positive for unexpected weight change. Negative for activity change and appetite change.       Working on weight loss with exercise and diet and continues to gain  HENT: Negative.   Respiratory: Negative.   Cardiovascular: Negative.   Gastrointestinal: Negative for abdominal distention, abdominal pain, constipation, diarrhea and vomiting.       Bloating at times with certain foods  Endocrine: Negative.   Genitourinary: Negative.   Musculoskeletal: Negative.   Neurological: Negative.   Hematological: Negative.   Psychiatric/Behavioral:       Medication use for depression only, no change in dosage       Objective:   Physical Exam  Constitutional: She is oriented to person, place, and time. She appears well-developed and well-nourished.  Neurological: She is alert and oriented to person, place, and time.  Psychiatric: She has a normal mood and affect. Her behavior is normal. Judgment and thought content normal.   Consult only no exam     Assessment:     Weight gain ? Metabolic change  Inconsistent diet control Muscle weight with work out R/O glucose intolerance  History of Vitamin D deficiency on  supplement Screening labs    Plan:      Discussed will await labs and possible referral to [facility] nutritional counseling for dietary management. Patient would be agreeable to this. Labs:TSH with panel, Vitamin D, Lipid panel, CMP, Hgb A1-c  Rv prn   20 minutes in face to face discussion of weight concern and diet and exercise review.

## 2017-08-25 NOTE — Telephone Encounter (Signed)
I think if we have not seen her since 3/19 she should have OV,check her Vitamin D and thyroid panel and her weight prior to referral

## 2017-08-25 NOTE — Patient Instructions (Signed)
Preventing Unhealthy Weight Gain, Adult Staying at a healthy weight is important. When fat builds up in your body, you may become overweight or obese. These conditions put you at greater risk for developing certain health problems, such as heart disease, diabetes, sleeping problems, joint problems, and some cancers. Unhealthy weight gain is often the result of making unhealthy choices in what you eat. It is also a result of not getting enough exercise. You can make changes to your lifestyle to prevent obesity and stay as healthy as possible. What nutrition changes can be made? To maintain a healthy weight and prevent obesity:  Eat only as much as your body needs. To do this: ? Pay attention to signs that you are hungry or full. Stop eating as soon as you feel full. ? If you feel hungry, try drinking water first. Drink enough water so your urine is clear or pale yellow. ? Eat smaller portions. ? Look at serving sizes on food labels. Most foods contain more than one serving per container. ? Eat the recommended amount of calories for your gender and activity level. While most active people should eat around 2,000 calories per day, if you are trying to lose weight or are not very active, you main need to eat less calories. Talk to your health care provider or dietitian about how many calories you should eat each day.  Choose healthy foods, such as: ? Fruits and vegetables. Try to fill at least half of your plate at each meal with fruits and vegetables. ? Whole grains, such as whole wheat bread, brown rice, and quinoa. ? Lean meats, such as chicken or fish. ? Other healthy proteins, such as beans, eggs, or tofu. ? Healthy fats, such as nuts, seeds, fatty fish, and olive oil. ? Low-fat or fat-free dairy.  Check food labels and avoid food and drinks that: ? Are high in calories. ? Have added sugar. ? Are high in sodium. ? Have saturated fats or trans fats.  Limit how much you eat of the following  foods: ? Prepackaged meals. ? Fast food. ? Fried foods. ? Processed meat, such as bacon, sausage, and deli meats. ? Fatty cuts of red meat and poultry with skin.  Cook foods in healthier ways, such as by baking, broiling, or grilling.  When grocery shopping, try to shop around the outside of the store. This helps you buy mostly fresh foods and avoid canned and prepackaged foods.  What lifestyle changes can be made?  Exercise at least 30 minutes 5 or more days each week. Exercising includes brisk walking, yard work, biking, running, swimming, and team sports like basketball and soccer. Ask your health care provider which exercises are safe for you.  Do not use any products that contain nicotine or tobacco, such as cigarettes and e-cigarettes. If you need help quitting, ask your health care provider.  Limit alcohol intake to no more than 1 drink a day for nonpregnant women and 2 drinks a day for men. One drink equals 12 oz of beer, 5 oz of wine, or 1 oz of hard liquor.  Try to get 7-9 hours of sleep each night. What other changes can be made?  Keep a food and activity journal to keep track of: ? What you ate and how many calories you had. Remember to count sauces, dressings, and side dishes. ? Whether you were active, and what exercises you did. ? Your calorie, weight, and activity goals.  Check your weight regularly. Track any changes.   If you notice you have gained weight, make changes to your diet or activity routine.  Avoid taking weight-loss medicines or supplements. Talk to your health care provider before starting any new medicine or supplement.  Talk to your health care provider before trying any new diet or exercise plan. Why are these changes important? Eating healthy, staying active, and having healthy habits not only help prevent obesity, they also:  Help you to manage stress and emotions.  Help you to connect with friends and family.  Improve your  self-esteem.  Improve your sleep.  Prevent long-term health problems.  What can happen if changes are not made? Being obese or overweight can cause you to develop joint or bone problems, which can make it hard for you to stay active or do activities you enjoy. Being obese or overweight also puts stress on your heart and lungs and can lead to health problems like diabetes, heart disease, and some cancers. Where to find more information: Talk with your health care provider or a dietitian about healthy eating and healthy lifestyle choices. You may also find other information through these resources:  U.[place]. Department of Agriculture MyPlate: www.choosemyplate.gov  American Heart Association: www.heart.org  Centers for Disease Control and Prevention: www.cdc.gov  Summary  Staying at a healthy weight is important. It helps prevent certain diseases and health problems, such as heart disease, diabetes, joint problems, sleep disorders, and some cancers.  Being obese or overweight can cause you to develop joint or bone problems, which can make it hard for you to stay active or do activities you enjoy.  You can prevent unhealthy weight gain by eating a healthy diet, exercising regularly, not smoking, limiting alcohol, and getting enough sleep.  Talk with your health care provider or a dietitian for guidance about healthy eating and healthy lifestyle choices. This information is not intended to replace advice given to you by your health care provider. Make sure you discuss any questions you have with your health care provider. Document Released: 01/01/2016 Document Revised: 02/06/2016 Document Reviewed: 02/06/2016 Elsevier Interactive Patient Education  2018 [employer]  

## 2017-08-26 LAB — COMPREHENSIVE METABOLIC PANEL
ALBUMIN: 4.1 g/dL (ref 3.5–5.5)
ALK PHOS: 62 IU/L (ref 39–117)
ALT: 37 IU/L — ABNORMAL HIGH (ref 0–32)
AST: 30 IU/L (ref 0–40)
Albumin/Globulin Ratio: 2.3 — ABNORMAL HIGH (ref 1.2–2.2)
BUN / CREAT RATIO: 26 — AB (ref 9–23)
BUN: 21 mg/dL — ABNORMAL HIGH (ref 6–20)
Bilirubin Total: <0.2 mg/dL (ref 0.0–1.2)
CALCIUM: 9.4 mg/dL (ref 8.7–10.2)
CO2: 24 mmol/L (ref 20–29)
CREATININE: 0.81 mg/dL (ref 0.57–1.00)
Chloride: 105 mmol/L (ref 96–106)
GFR, EST AFRICAN AMERICAN: 110 mL/min/{1.73_m2} (ref >59)
GFR, EST NON AFRICAN AMERICAN: 95 mL/min/{1.73_m2} (ref >59)
GLOBULIN, TOTAL: 1.8 g/dL (ref 1.5–4.5)
Glucose: 87 mg/dL (ref 65–99)
Potassium: 4.6 mmol/L (ref 3.5–5.2)
SODIUM: 142 mmol/L (ref 134–144)
TOTAL PROTEIN: 5.9 g/dL — AB (ref 6.0–8.5)

## 2017-08-26 LAB — HEMOGLOBIN A1C
Est. average glucose Bld gHb Est-mCnc: 103 mg/dL
Hgb A1c MFr Bld: 5.2 % (ref 4.8–5.6)

## 2017-08-26 LAB — LIPID PANEL
CHOL/HDL RATIO: 5.7 ratio — AB (ref 0.0–4.4)
Cholesterol, Total: 234 mg/dL — ABNORMAL HIGH (ref 100–199)
HDL: 41 mg/dL (ref >39)
LDL CALC: 145 mg/dL — AB (ref 0–99)
Triglycerides: 242 mg/dL — ABNORMAL HIGH (ref 0–149)
VLDL Cholesterol Cal: 48 mg/dL — ABNORMAL HIGH (ref 5–40)

## 2017-08-26 LAB — THYROID PANEL WITH TSH
Free Thyroxine Index: 1.5 (ref 1.2–4.9)
T3 UPTAKE RATIO: 23 % — AB (ref 24–39)
T4 TOTAL: 6.5 ug/dL (ref 4.5–12.0)
TSH: 0.839 u[IU]/mL (ref 0.450–4.500)

## 2017-08-26 LAB — VITAMIN D 25 HYDROXY (VIT D DEFICIENCY, FRACTURES): Vit D, 25-Hydroxy: 35.3 ng/mL (ref 30.0–100.0)

## 2017-08-27 ENCOUNTER — Telehealth: Payer: Self-pay | Admitting: *Deleted

## 2017-08-27 DIAGNOSIS — R635 Abnormal weight gain: Secondary | ICD-10-CM

## 2017-08-27 NOTE — Telephone Encounter (Signed)
-----   Message from Verner Choleborah S Leonard, CNM sent at 08/27/2017  9:35 AM EDT ----- Notify patient her CMP shows slight elevation of BUN and ratio increased. Her total protein is borderline low. Her albumin globulin/ratio slightly increased. ALT elevated can be from OTC medication use which affects the liver Lipid panel shows elevated cholesterol, triglycerides,LDL and VLDL cholesterol, HDL is normal TSH with panel is essentially normal, no significant change Vitamin D is normal range at 35.3 She can continue on Vitamin D 3 1000 IU daily now Hgb A1-C is normal  She needs to work with her PCP on her lipid panel management due to overall profile being elevated. She will need CMP followed there also now.  I still feel she needs nutritional counseling with [facility], so referral is recommended. We can do that for her or PCP, but she needs PCP management regarding labs.

## 2017-08-27 NOTE — Telephone Encounter (Signed)
Message left to return call to Triage Nurse at [phone].    

## 2017-08-27 NOTE — Telephone Encounter (Signed)
Patient returned call. All results reviewed with patient and she verbalized understanding. Patient states that she can make an appointment with Dr. Clent RidgesFry, but it will be about a month out because "it is our busy season at work." RN advised patient to go ahead and call and schedule to get an appointment so she doesn't get booked out further. Patient agreeable. Advised patient Dr. Clent RidgesFry is on Epic, so will be able to see results in her chart. Patient states she would like our office to place referral to nutritionist. [facility].   Routing to provider for final review. Patient agreeable to disposition. Will close encounter.

## 2017-09-18 ENCOUNTER — Encounter: Payer: Self-pay | Admitting: Family Medicine

## 2017-09-18 ENCOUNTER — Ambulatory Visit: Payer: BC Managed Care – PPO | Admitting: Family Medicine

## 2017-09-18 VITALS — BP 102/80 | HR 88 | Temp 98.1°F | Ht 67.75 in | Wt 189.0 lb

## 2017-09-18 DIAGNOSIS — E785 Hyperlipidemia, unspecified: Secondary | ICD-10-CM

## 2017-09-18 DIAGNOSIS — R635 Abnormal weight gain: Secondary | ICD-10-CM | POA: Diagnosis not present

## 2017-09-18 LAB — T4, FREE: Free T4: 0.72 ng/dL (ref 0.60–1.60)

## 2017-09-18 LAB — T3, FREE: T3 FREE: 3.5 pg/mL (ref 2.3–4.2)

## 2017-09-18 MED ORDER — ATORVASTATIN CALCIUM 10 MG PO TABS: 10.0000 mg | ORAL_TABLET | Freq: Every day | ORAL | 3 refills | Status: DC

## 2017-09-21 ENCOUNTER — Encounter: Payer: Self-pay | Admitting: Family Medicine

## 2017-09-21 DIAGNOSIS — E785 Hyperlipidemia, unspecified: Secondary | ICD-10-CM | POA: Insufficient documentation

## 2017-09-21 LAB — T3, REVERSE: T3, Reverse: 9 ng/dL (ref 8–25)

## 2017-09-21 NOTE — Progress Notes (Signed)
   Subjective:    Patient ID: Misty Sanchez, female    DOB: Apr 16, 1980, 37 y.o.   MRN: [id number]  HPI Here for several issues. First she wants to discuss some recent lab results that were ordered by her GYN. These include a lipid panel showing a total cholesterol of 234 and an LDL of 145. She says she already eats a very healthy diet but notes she has a strong family of high cholesterol. Also she is frustrated at her inability to lose weight. She eats healthily and exercises daily but to no avail. Her GYN did some thyroid screens but she asks for some more testing.    Review of Systems  Constitutional: Negative.   Respiratory: Negative.   Cardiovascular: Negative.   Endocrine: Negative.   Neurological: Negative.        Objective:   Physical Exam  Constitutional: She is oriented to person, place, and time. She appears well-developed and well-nourished.  Neck: No thyromegaly present.  Cardiovascular: Normal rate, regular rhythm, normal heart sounds and intact distal pulses.  Pulmonary/Chest: Effort normal and breath sounds normal.  Lymphadenopathy:    She has no cervical adenopathy.  Neurological: She is alert and oriented to person, place, and time.          Assessment & Plan:  We agreed for her to start a statin for th cholesterol and she will take Lipitor 10 mg daily. We will recheck labs in 90 days. As for the weight issue, we will order a free T3 and T4 as well as a reverse T3.  Gershon Crane, MD

## 2017-09-23 ENCOUNTER — Encounter: Payer: Self-pay | Admitting: *Deleted

## 2017-09-23 ENCOUNTER — Encounter: Payer: Self-pay | Admitting: Family Medicine

## 2017-09-23 ENCOUNTER — Telehealth: Payer: Self-pay | Admitting: Family Medicine

## 2017-09-23 NOTE — Telephone Encounter (Signed)
Copied from CRM [id number]. Topic: Quick Communication - See Telephone Encounter >> Sep 23, 2017 10:27 AM Angela Nevin wrote: CRM for notification. See Telephone encounter for: 09/23/17. Pt is calling to check status of recent lab results. Pt stated she would like to hear back asap if possible. Please advise.

## 2017-09-23 NOTE — Telephone Encounter (Signed)
Attempted to contact pt regarding labs; initiated conference call with [place] at [facility]; pt but unable to contacted pt; left message on voicemail [phone] for pt to call office; when pt calls back please refer her to [place] (Dr Claris Che CMA).

## 2017-09-23 NOTE — Telephone Encounter (Signed)
I am not sure what else to suggest to her but I would be happy to refer her to Endocrine if she wishes

## 2017-09-24 ENCOUNTER — Telehealth: Payer: Self-pay | Admitting: *Deleted

## 2017-09-24 DIAGNOSIS — R635 Abnormal weight gain: Secondary | ICD-10-CM

## 2017-09-24 NOTE — Telephone Encounter (Signed)
I understand her frustration but I cannot find anything wrong with her thyroid. I will refer her to Endocrine to explore other possibilities

## 2017-09-24 NOTE — Telephone Encounter (Signed)
Called and spoke with pt. Pt advised and voiced understanding.  

## 2017-09-24 NOTE — Telephone Encounter (Signed)
The patient has agreed to having a referral placed.

## 2017-09-24 NOTE — Telephone Encounter (Signed)
Mychart message sent to the pt yesterday about her results that were normal.  Pt responded and was not pleased with this and feels like her symptoms are not being considered.  I have called and lmomtcb x 1 for the pt to call me back to address this with her.

## 2017-09-24 NOTE — Telephone Encounter (Signed)
Pt called back.  She stated that she spoke with Dr. Clent RidgesFry at her last visit and voiced her concerns about weight gain and a few other concerns.  She stated that the full thyroid panel was done and she was told that the numbers are normal.  She is frustrated and concerned that just because the numbers are normal, she feels there is still something else going on.  She is concerned that Dr. Clent RidgesFry did not call and speak to her about this.  She feels that she is being ignored.  I advised her that this was not the case and that I would forward this to Dr. Clent RidgesFry today for him to address.  Please advise Dr. Clent RidgesFry.  Pt would like to hear back from Dr. Clent RidgesFry personally.

## 2017-09-25 ENCOUNTER — Encounter: Payer: BC Managed Care – PPO | Attending: Family Medicine | Admitting: Registered"

## 2017-09-25 ENCOUNTER — Telehealth: Payer: Self-pay | Admitting: Certified Nurse Midwife

## 2017-09-25 DIAGNOSIS — Z713 Dietary counseling and surveillance: Secondary | ICD-10-CM | POA: Diagnosis not present

## 2017-09-25 DIAGNOSIS — R635 Abnormal weight gain: Secondary | ICD-10-CM | POA: Insufficient documentation

## 2017-09-25 NOTE — Telephone Encounter (Signed)
Spoke with patient. Patient seen in office on 08/27/17 for unexplained weight gain. Patient states she has since f/u with her PCP and nutritionist, started on Lipitor. Patient states nutritionist recommended her f/u with GYN for further evaluation of PCOS. No new symptoms since last OV.  Patient has seen Dr. Oscar LaJertson in the past, would like to schedule with her if MD recommended.   Advised I will review with Leota Sauerseborah Leonard, CNM and Dr. Oscar LaJertson and return call to schedule. Patient agreeable.   Leota Sauerseborah Leonard, CNM -please review.  Cc: Dr. Reyne DumasJerston

## 2017-09-25 NOTE — Patient Instructions (Addendum)
Consider looking into PCOS to see if you identify with the symptoms [email or web address] Consider including more carbohydrate with breakfast. Consider trying pistachios and oatmeal to help with cholesterol Consider using more olive oil in your diet Consider eating more foods with vitamin A and iron

## 2017-09-25 NOTE — Telephone Encounter (Signed)
Recommend visit with Dr. Oscar LaJertson

## 2017-09-25 NOTE — Telephone Encounter (Signed)
Spoke with patient. OV scheduled for 9/30 at 3pm with Dr. Oscar LaJertson. Patient verbalizes understanding and is agreeable. Encounter closed.

## 2017-09-25 NOTE — Telephone Encounter (Signed)
Patient would like an appointment to be evaluated for pcos.

## 2017-09-29 ENCOUNTER — Encounter: Payer: Self-pay | Admitting: Registered"

## 2017-09-29 DIAGNOSIS — Z713 Dietary counseling and surveillance: Secondary | ICD-10-CM | POA: Insufficient documentation

## 2017-09-29 NOTE — Progress Notes (Addendum)
Medical Nutrition Therapy:  Appt start time: 1020 end time:  1105.   Assessment:  Primary concerns today: Unexplained weight gain. Pt states over the last 5 yrs patient states she has been gaining weight even though she exercises frequently and hasn't changed her diet. Pt states it has been concerning her over the last 2 years because the weight gain is accelerating, stated one time she gained 7 lbs in 2 weeks. Pt reports 5 yrs ago she weighed 140 lbs and now is about 186 lbs.   Pt states she is picky eater, mostly due to texture aversions. Pt states she does not eat many fruits or vegetables. Pt states she will eat peas, legumes, limited potatoes, carrots, cashews, peanuts, rice.  With unexplained rapid weight gain RD assesses for additional symptoms that may indicate PCOS:  Weight gain: Pt reports that she has noticed gain more pronounced in her mid-section. May be a sign of insulin resistance. (A1c 5.2% 08-25-17, if insulin resistant, pt may have hyperinsulinemia compensating)  Menstrual cycle: Patient states she has been on the Depo-Provera shot for 15 yrs due to period issues including extremely heavy, cramping and painful periods including nausea which affected intake to the point she would lose weight every month and then regain after symptoms resolved. Pt states she recently switched to IUD due to possibility the Depo contributing to weight gain.  Labs: history of low Vitamin D levels. Cholesterol & TG: Pt states her lipid labs suddenly increased without explanation, ttl chol 234, TG 242, HDL 41.  Energy: Patient states she is always tired. Rated energy level at 6/10. Patient states when alarm goes off she uses the snooze as much as she can and feels like her muscles have heavy weights in them, even hard to lift her arm to push the snooze button. (similar heavy muscle description of other women with PCOS). Pt states sometimes she finds herself she dozing at work. 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  Anxiety: Pt states small things cause her anxiety that she feels should not affect her. Pt states Prozac that she started in January helps.  Hair: Pt states she notices she is starting to notice hair loss. Pt also reports dry itchy skin including on scalp. (may be related to insulin resistance, low iron, and/or low intake of vitamin A rich foods) Pt denies hirsutism.  Physical activity: 3x week at gym 5:30-7:30, works with Systems analystpersonal trainer, does spin class and other aerobic activity. Pt states she does not enjoy weight training that much and lately feels like she is losing strength instead of gaining strength. Pt states she also works out at home in on the weekends at home.  Sleep: 5-6 hrs Pt states she tries to go to sleep at 10:30 but usually closer to 11:30 before sleeping. Pt states she falls asleep on couch. Pt states tries to catch up on sleep on weekends.  Per chart patient also experiences frequent UTI. RD may be able to make suggestions at next visit.  Preferred Learning Style:   No preference indicated   Learning Readiness:   Contemplating   MEDICATIONS: reviewed   DIETARY INTAKE:  24-hr recall:  B ( AM): at home has meal replacement shake - Ideal Lean. After she gets to work has 1-2 hard boiled eggs.  Snk ( AM): dry cheerios  L ( PM): 1/2 c rice, 1/2 c beans, 1.5 oz chicken  OR 1x/week eats out grilled chkn or [place] burger without bun, fries OR pizza Snk ( PM): cheerios, fiber one brownie OR ideal shape meal replacement bar if going to gym D ( PM): (goes to mom's house after gym) chicken, tuna, pork tenderloin, beans, peas. (dad has CD). If cooking at home may have m&ms before dinner. Snk ( PM): none Beverages: water lipton green tea  Usual physical activity: 5x week, moderate/intense activity 60-120 min  Estimated energy needs: 2000 calories  Progress Towards Goal(s):   New goal.   Nutritional Diagnosis:  NB-1.1 Food and nutrition-related knowledge deficit As related to amount and types of food needed to support activity.  As evidenced by lack of carbohydrates for breakfast, excess exercise and low energy .    Intervention:  Nutrition Education. Discussed ways the body controls weight beyond diet and exercise and the role hormones play. Discussed balanced eating the role of carbohydrates  Consider looking into PCOS to see if you identify with the symptoms [email or web address] Consider including more carbohydrate with breakfast. Consider trying pistachios and oatmeal to help with cholesterol Consider using more olive oil in your diet Consider eating more foods with vitamin A and iron  Teaching Method Utilized:  Visual Auditory  Handouts given during visit include:  MyPlate Planner  Barriers to learning/adherence to lifestyle change: fear of weight gain  Demonstrated degree of understanding via:  Teach Back   Monitoring/Evaluation:  Dietary intake, exercise, energy level, and body weight in 1 month(s).

## 2017-10-08 NOTE — Progress Notes (Signed)
GYNECOLOGY  VISIT   HPI: 37 y.o.   Single White or Caucasian Not Hispanic or Latino  female   [id number] with No LMP recorded. (Menstrual status: IUD).   here to discus PCOS symptoms.    The patient has a mirena IUD, placed in 4/19. Prior to that she had been on depo-provera for 15 years. Since April she has gained 11 lbs.  Since 4/18 she has gained 33 lbs.  TFT's from 8/19 revealed a normal TSH of 0.839, normal total T4 and Free thyroxine index. T3 uptake ratio was just under normal at 23.  HgbA1C was normal at 5.2. Her lipid panel was abnormal.  She has seen her primary and a nutritionist, has been started on Lipitor.  She c/o an increase in hair on her face (mainly light hair) in the last year, only an occasional dark hair above her lip. She has a random hair around her nipple. No dark hair on her lower abdomen. Just sporadic acne. She is wanting testing for PCOS and insulin resistance.  She is eating healthy diet, counting her calories, measuring portion size.  She works out 6 days a week, ~8 hours a week total, sweating a lot.  She is measuring her food. She drinks 8 oz of skim mild a day. The rest of her liquid intake is water. No ETOH intake.   GYNECOLOGIC HISTORY: No LMP recorded. (Menstrual status: IUD). Contraception:IUD Mirena Menopausal hormone therapy: none        OB History    Gravida  0   Para  0   Term  0   Preterm  0   AB  0   Living  0     SAB  0   TAB  0   Ectopic  0   Multiple  0   Live Births  0              Patient Active Problem List   Diagnosis Date Noted  . Nutritional counseling 09/29/2017  . Dyslipidemia 09/21/2017  . Tachycardia 03/24/2017  . Dysmenorrhea 05/16/2013    Class: History of  . UTI'S, CHRONIC 08/12/2006  . ADHD 07/29/2006    Past Medical History:  Diagnosis Date  . ADHD (attention deficit hyperactivity disorder)   . Frequent UTI   . IC (interstitial cystitis)     Past Surgical History:  Procedure Laterality Date   . TYMPANOSTOMY TUBE PLACEMENT Bilateral 03/2013  . WISDOM TOOTH EXTRACTION      Current Outpatient Medications  Medication Sig Dispense Refill  . atorvastatin (LIPITOR) 10 MG tablet Take 1 tablet (10 mg total) by mouth daily. 90 tablet 3  . Cholecalciferol (VITAMIN D PO) Take by mouth daily.    Marland Kitchen FLUoxetine (PROZAC) 10 MG tablet Take 1 tablet (10 mg total) by mouth daily. 90 tablet 3  . IRON PO Take by mouth daily.    . Levonorgestrel (MIRENA, 52 MG, IU) by Intrauterine route.    . propranolol (INDERAL) 10 MG tablet Take 1 tablet (10 mg total) by mouth daily as needed. One hour prior to appointment 30 tablet 5   No current facility-administered medications for this visit.      ALLERGIES: Azithromycin  Family History  Problem Relation Age of Onset  . Hypertension Mother   . Celiac disease Father   . Diabetes Paternal Uncle   . Drug abuse Paternal Uncle        heroin  . Cancer Maternal Grandmother  gland behind the ear    Social History   Socioeconomic History  . Marital status: Single    Spouse name: Not on file  . Number of children: Not on file  . Years of education: Not on file  . Highest education level: Not on file  Occupational History  . Not on file  Social Needs  . Financial resource strain: Not on file  . Food insecurity:    Worry: Not on file    Inability: Not on file  . Transportation needs:    Medical: Not on file    Non-medical: Not on file  Tobacco Use  . Smoking status: Never Smoker  . Smokeless tobacco: Never Used  Substance and Sexual Activity  . Alcohol use: No  . Drug use: No  . Sexual activity: Not Currently    Partners: Male    Birth control/protection: IUD    Comment: Mirena IUD   Lifestyle  . Physical activity:    Days per week: Not on file    Minutes per session: Not on file  . Stress: Not on file  Relationships  . Social connections:    Talks on phone: Not on file    Gets together: Not on file    Attends religious  service: Not on file    Active member of club or organization: Not on file    Attends meetings of clubs or organizations: Not on file    Relationship status: Not on file  . Intimate partner violence:    Fear of current or ex partner: Not on file    Emotionally abused: Not on file    Physically abused: Not on file    Forced sexual activity: Not on file  Other Topics Concern  . Not on file  Social History Narrative  . Not on file    Review of Systems  Constitutional:       Weight gain  HENT: Negative.   Eyes: Negative.   Respiratory: Negative.   Cardiovascular: Negative.   Gastrointestinal: Negative.   Endocrine: Negative.   Genitourinary: Negative.   Musculoskeletal: Negative.   Allergic/Immunologic: Negative.   Neurological: Negative.   Hematological: Negative.   Psychiatric/Behavioral: Negative.     PHYSICAL EXAMINATION:    BP 128/82 (BP Location: Right Arm, Patient Position: Sitting, Cuff Size: Normal)   Pulse 84   Ht 5\' 7"  (1.702 m)   Wt 192 lb 3.2 oz (87.2 kg)   BMI 30.10 kg/m     General appearance: alert, cooperative and appears stated age Skin: no clear hirsutism or acne  ASSESSMENT Continued weight gain with calorie restriction and vigorous exercise. She has been off of depo-provera for over 6 months, continuing to gain weight. She could potentially have side effects from the depo for 12-18 months after her last shot.  She has developed elevated lipids and is on lipitor She is worried about PCOS and insulin resistance. Minimal hirsutism. Desires testing    PLAN Return for testosterone, fasting insulin and glucose Refer to Dr Dalbert Garnet for medical weight loss  Discussed that the depo-provera could still be causing weight gain   An After Visit Summary was printed and given to the patient.  ~15 minutes face to face time of which over 50% was spent in counseling.

## 2017-10-12 ENCOUNTER — Encounter: Payer: Self-pay | Admitting: Obstetrics and Gynecology

## 2017-10-12 ENCOUNTER — Other Ambulatory Visit: Payer: Self-pay

## 2017-10-12 ENCOUNTER — Ambulatory Visit (INDEPENDENT_AMBULATORY_CARE_PROVIDER_SITE_OTHER): Payer: BC Managed Care – PPO | Admitting: Obstetrics and Gynecology

## 2017-10-12 VITALS — BP 128/82 | HR 84 | Ht 67.0 in | Wt 192.2 lb

## 2017-10-12 DIAGNOSIS — L680 Hirsutism: Secondary | ICD-10-CM | POA: Diagnosis not present

## 2017-10-12 DIAGNOSIS — E785 Hyperlipidemia, unspecified: Secondary | ICD-10-CM

## 2017-10-12 DIAGNOSIS — Z6830 Body mass index (BMI) 30.0-30.9, adult: Secondary | ICD-10-CM | POA: Diagnosis not present

## 2017-10-12 DIAGNOSIS — R635 Abnormal weight gain: Secondary | ICD-10-CM

## 2017-10-13 ENCOUNTER — Other Ambulatory Visit (INDEPENDENT_AMBULATORY_CARE_PROVIDER_SITE_OTHER): Payer: BC Managed Care – PPO

## 2017-10-13 DIAGNOSIS — R635 Abnormal weight gain: Secondary | ICD-10-CM

## 2017-10-13 DIAGNOSIS — E785 Hyperlipidemia, unspecified: Secondary | ICD-10-CM

## 2017-10-13 DIAGNOSIS — L680 Hirsutism: Secondary | ICD-10-CM

## 2017-10-13 DIAGNOSIS — Z6830 Body mass index (BMI) 30.0-30.9, adult: Secondary | ICD-10-CM

## 2017-10-13 NOTE — Addendum Note (Signed)
Addended by: Joeseph Amor on: 10/13/2017 09:42 AM   Modules accepted: Orders

## 2017-10-14 LAB — INSULIN, RANDOM: INSULIN: 12.9 u[IU]/mL (ref 2.6–24.9)

## 2017-10-15 ENCOUNTER — Telehealth: Payer: Self-pay | Admitting: Certified Nurse Midwife

## 2017-10-15 LAB — TESTT+TESTF+SHBG
SEX HORMONE BINDING: 37.2 nmol/L (ref 24.6–122.0)
TESTOSTERONE, TOTAL: 16.2 ng/dL (ref 10.0–55.0)
Testosterone, Free: 1 pg/mL (ref 0.0–4.2)

## 2017-10-15 LAB — GLUCOSE, RANDOM: Glucose: 91 mg/dL (ref 65–99)

## 2017-10-15 NOTE — Telephone Encounter (Signed)
Patient calling for fasting lab results.

## 2017-10-15 NOTE — Telephone Encounter (Signed)
Misty Sanchez, CNM -please review labs dated 10/13/17 and advise.

## 2017-10-16 NOTE — Telephone Encounter (Signed)
This was reviewed by Dr Oscar La and my chart message sent to patient by her.

## 2017-10-19 ENCOUNTER — Telehealth: Payer: Self-pay | Admitting: Family Medicine

## 2017-10-19 NOTE — Telephone Encounter (Signed)
Dr. Clent Ridges, ok to change PCP?

## 2017-10-19 NOTE — Telephone Encounter (Signed)
Called and spoke with pt and she is aware of Dr. Clent Ridges is ok with her changing her PCP>  Future appts with Dr. Clent Ridges have been cancelled.  Nothing further is needed.

## 2017-10-19 NOTE — Telephone Encounter (Signed)
Copied from CRM [id number]. Topic: Appointment Scheduling - Scheduling Inquiry for Clinic >> Oct 19, 2017  9:59 AM Arlyss Gandy, NT wrote: Reason for CRM: Pt would like to change providers from Dr. Clent Ridges to Dr. Artis Flock at [facility]. No reason for the change given. Please call to set up appt is approved.

## 2017-10-19 NOTE — Telephone Encounter (Signed)
Pt would like to see Dr Artis Flock this Thursday afternoon or Monday 10/14 for TOC.   Unable to schedule. It that OK? Pt wants to discuss weight gain, and female issues. Switching from Dr Lysle Morales due to wanting a female dr now, Please advise if ok, thanks.

## 2017-10-19 NOTE — Telephone Encounter (Signed)
Attempted to contact patient to schedule TOC appointment. I left a voicemail.

## 2017-10-19 NOTE — Telephone Encounter (Signed)
That's okay with me. 

## 2017-10-19 NOTE — Telephone Encounter (Signed)
Sure I can see her

## 2017-10-29 ENCOUNTER — Ambulatory Visit: Payer: BC Managed Care – PPO | Admitting: Registered"

## 2017-10-29 ENCOUNTER — Encounter: Payer: Self-pay | Admitting: Family Medicine

## 2017-10-29 ENCOUNTER — Ambulatory Visit: Payer: BC Managed Care – PPO | Admitting: Family Medicine

## 2017-10-29 VITALS — BP 110/64 | HR 78 | Temp 98.1°F | Ht 67.0 in | Wt 192.0 lb

## 2017-10-29 DIAGNOSIS — E782 Mixed hyperlipidemia: Secondary | ICD-10-CM

## 2017-10-29 DIAGNOSIS — R635 Abnormal weight gain: Secondary | ICD-10-CM

## 2017-10-29 DIAGNOSIS — F411 Generalized anxiety disorder: Secondary | ICD-10-CM | POA: Diagnosis not present

## 2017-10-29 NOTE — Progress Notes (Signed)
Patient: Misty Sanchez MRN: [id number] DOB: February 12, 1980 PCP: Orland Mustard, MD     Subjective:  Chief Complaint  Patient presents with  . Transitions Of Care    previous pt of Dr. Mateo Flow weight issues    HPI: The patient is a 37 y.o. female who presents today for annual exam. She denies any changes to past medical history. There have been no recent hospitalizations. They are following a well balanced diet and exercise plan. Weight has been increasing steadily. No complaints today.   Hyperlipidemia: She was started on lipitor in September of 2019 for elevated cholesterol. She was NOT fasting. She has no FH of CAD, mom has hyperlipidemia. She has no BP, diabetes, smoking history. She was not fasting for these labs. They started her on lipitor regardless.   Weight gain: she has gained 35 pounds in the last 1.5 years. She works out a lot including cardio (spin). She has PCOS ruled out. When she is doing cardio she is doing 30 minutes 5x/week and can not talk because she is working so hard. She saw nutritionist she said her diet was good and gave her some additions to her diet. She also does classes at [employer]. She also has a Psychologist, educational. She has not lost on any weight. She does like sugar, but states she tried to cut it out and became very cranky.  She eats m&ms daily. She has a food tracker and fitbit as well. She is doing over 10,000 steps/day. She only gets 5 hours/sleep at night. She does snore, but states it's not bad.   GAD: she was diagnosed with anxiety in 01/2016. She is currently only on 10mg . She feels like she could do an increase. She has no panic attacks. She feels like she is worry wart and is always anxious. Anxiety runs in her family in her mother and grandmother. No depression, no si/hi/ah/vh. She has a good support system with her family and friends.   Immunization History  Administered Date(s) Administered  . Influenza Split 10/11/2016  . Influenza Whole 10/20/2009  .  Influenza-Unspecified 10/22/2013, 10/21/2014, 10/13/2015, 10/24/2017  . Tdap 06/14/2006, 01/11/2016   Pap smear: utd. Yearly check up in 03/2017. Due in 2020 Tdap: utd Gc/c: not sexually active. Not needed.    Review of Systems  Constitutional: Positive for fatigue. Negative for chills and fever.  HENT: Negative for dental problem, ear pain, hearing loss and trouble swallowing.   Eyes: Negative for visual disturbance.  Respiratory: Negative for cough, chest tightness and shortness of breath.   Cardiovascular: Negative for chest pain, palpitations and leg swelling.  Gastrointestinal: Negative for abdominal pain, blood in stool, diarrhea and nausea.  Endocrine: Negative for cold intolerance, polydipsia, polyphagia and polyuria.  Genitourinary: Negative for dysuria and hematuria.  Musculoskeletal: Negative for arthralgias and back pain.  Skin: Negative for rash.  Neurological: Negative for dizziness and headaches.  Psychiatric/Behavioral: Positive for sleep disturbance. Negative for dysphoric mood. The patient is nervous/anxious.     Allergies Patient is allergic to azithromycin.  Past Medical History Patient  has a past medical history of ADHD (attention deficit hyperactivity disorder), Anxiety, Frequent UTI, Hyperlipidemia, and IC (interstitial cystitis).  Surgical History Patient  has a past surgical history that includes Wisdom tooth extraction and Tympanostomy tube placement (Bilateral, 03/2013).  Family History Pateint's family history includes Cancer in her maternal grandmother; Celiac disease in her father; Diabetes in her paternal uncle; Drug abuse in her paternal uncle; Hypertension in her mother.  Social History  Patient  reports that she has never smoked. She has never used smokeless tobacco. She reports that she does not drink alcohol or use drugs.    Objective: Vitals:   10/29/17 1019  BP: 110/64  Pulse: 78  Temp: 98.1 F (36.7 C)  TempSrc: Oral  SpO2: 98%   Weight: 192 lb (87.1 kg)  Height: 5\' 7"  (1.702 m)    Body mass index is 30.07 kg/m.  Physical Exam  Constitutional: She is oriented to person, place, and time. She appears well-developed and well-nourished.  HENT:  Right Ear: External ear normal.  Left Ear: External ear normal.  Mouth/Throat: Oropharynx is clear and moist.  Eyes: Pupils are equal, round, and reactive to light. Conjunctivae and EOM are normal.  Neck: Normal range of motion. Neck supple. No thyromegaly present.  Cardiovascular: Normal rate, regular rhythm, normal heart sounds and intact distal pulses.  No murmur heard. Pulmonary/Chest: Effort normal and breath sounds normal.  Abdominal: Soft. Bowel sounds are normal. She exhibits no distension. There is no tenderness.  Lymphadenopathy:    She has no cervical adenopathy.  Neurological: She is alert and oriented to person, place, and time. She displays normal reflexes. No cranial nerve deficit. Coordination normal.  Skin: Skin is warm and dry. No rash noted.  Psychiatric: She has a normal mood and affect. Her behavior is normal.  Vitals reviewed.      GAD 7 : Generalized Anxiety Score 10/29/2017  Nervous, Anxious, on Edge 2  Control/stop worrying 2  Worry too much - different things 2  Trouble relaxing 1  Restless 1  Easily annoyed or irritable 2  Afraid - awful might happen 0  Total GAD 7 Score 10  Anxiety Difficulty Somewhat difficult    Depression screen [facility] 2/9 10/29/2017 09/29/2017  Decreased Interest 1 0  Down, Depressed, Hopeless 1 0  PHQ - 2 Score 2 0  Altered sleeping 1 -  Tired, decreased energy 3 -  Change in appetite 0 -  Feeling bad or failure about yourself  1 -  Trouble concentrating 1 -  Moving slowly or fidgety/restless 0 -  Suicidal thoughts 0 -  PHQ-9 Score 8 -  Difficult doing work/chores Somewhat difficult -   Assessment/plan: 1. GAD (generalized anxiety disorder) Not as well controlled as I would like. GAD7 score is a 10 and  she also feels like she is constantly worrying. ON a tiny dose of prozac, so I'm going to increase this to 20mg  and see her back in 1 month. Already exercising on a regular basis. Could consider counseling as well.   2. Mixed hyperlipidemia Im not impressed with her lipid panel from last draw and she WAS NOT fasting. Can not even calculate a ASCVD risk score and she has no other risk factors. We are going to do a trial off medication x 3 months and then recheck a fasting panel as well as high sensitivity crp. If indicated will treat at this time.  - Lipid panel; Future - Comprehensive metabolic panel; Future - CRP High sensitivity; Future  3. Weight gain Would like her to cut sugar out more and work on sleeping >7 hours/night. Will have her f/u with Dr. Earlene Plater to discuss weight loss options in more detail.      Return in about 1 month (around 11/29/2017) for anxiety. lab only in 3 months .   Orland Mustard, MD [place] [facility]  10/29/2017

## 2017-10-29 NOTE — Patient Instructions (Signed)
Increasing your prozac to 20mg . Just take 2 pills. i'll see you back in one month.   We are going to recheck your cholesterol in 3 months time. Stop the medication. I don't think you need this at this time. You have no risk factors and you weren't fasting. Make sure you don't eat or drink for labs for 8 hours. Water and black coffee are okay. I added on a specific test for your heart as well.   I would cut out sugar a little bit more. We want to make sure you are doing everything in regards to your weight loss.

## 2017-10-30 ENCOUNTER — Encounter: Payer: Self-pay | Admitting: Family Medicine

## 2017-10-30 ENCOUNTER — Ambulatory Visit: Payer: BC Managed Care – PPO | Admitting: Family Medicine

## 2017-10-30 VITALS — BP 110/72 | HR 81 | Temp 98.2°F | Ht 67.0 in | Wt 192.8 lb

## 2017-10-30 DIAGNOSIS — E282 Polycystic ovarian syndrome: Secondary | ICD-10-CM | POA: Insufficient documentation

## 2017-10-30 DIAGNOSIS — F902 Attention-deficit hyperactivity disorder, combined type: Secondary | ICD-10-CM

## 2017-10-30 DIAGNOSIS — R635 Abnormal weight gain: Secondary | ICD-10-CM

## 2017-10-30 DIAGNOSIS — E88819 Insulin resistance, unspecified: Secondary | ICD-10-CM

## 2017-10-30 DIAGNOSIS — E8881 Metabolic syndrome: Secondary | ICD-10-CM | POA: Diagnosis not present

## 2017-10-30 MED ORDER — METFORMIN HCL ER 750 MG PO TB24: 750.0000 mg | ORAL_TABLET | Freq: Two times a day (BID) | ORAL | 1 refills | Status: DC

## 2017-10-30 MED ORDER — PHENTERMINE HCL 37.5 MG PO TABS: 37.5000 mg | ORAL_TABLET | Freq: Every day | ORAL | 1 refills | Status: DC

## 2017-10-30 NOTE — Progress Notes (Signed)
Misty Sanchez is a 37 y.o. female is here for weight loss discussion.  History of Present Illness:   HPI:   Patient interested in physician assisted weight loss. Current weight is 192 pounds with goal of 140-150 pounds. Weight gain accelerated a few years ago. On Depo Provera for 15 years per patient, then changed to Mirena. Likes lack of menses. Hx of severely painful periods. Suspected PCOS in the past (sugar cravings, acne, oily skin, hirsutism). She was told no PCOS based on normal labs. Mother with Hx of HTN. No FamHx of diabetes.   Usual eating pattern includes: The patient eats a regular, healthy diet. She admits to craving and eating sweets. Allows herself one sweet tea per week.     Usual physical activity includes strenuous exercise 3-5 days per week and works with trainer.  Current life stressors: none. Single with no children. Parents supportive. Likes her job.  Hx of ADHD. Not on medication since she was a child. She feels that she can redirect fairly well and has no issues with work.  Describes excessive cravings for sugar and bread. Gains wait in abdomen. Dyslipidemia.   Wt Readings from Last 3 Encounters:  10/30/17 192 lb 12.8 oz (87.5 kg)  10/29/17 192 lb (87.1 kg)  10/12/17 192 lb 3.2 oz (87.2 kg)   There are no preventive care reminders to display for this patient.   Depression screen [facility] 2/9 10/29/2017 09/29/2017  Decreased Interest 1 0  Down, Depressed, Hopeless 1 0  PHQ - 2 Score 2 0  Altered sleeping 1 -  Tired, decreased energy 3 -  Change in appetite 0 -  Feeling bad or failure about yourself  1 -  Trouble concentrating 1 -  Moving slowly or fidgety/restless 0 -  Suicidal thoughts 0 -  PHQ-9 Score 8 -  Difficult doing work/chores Somewhat difficult -   PMHx, SurgHx, SocialHx, FamHx, Medications, and Allergies were reviewed in the Visit Navigator and updated as appropriate.   Patient Active Problem List   Diagnosis Date Noted  . GAD (generalized  anxiety disorder) 10/29/2017  . Dyslipidemia 09/21/2017  . ADHD (attention deficit hyperactivity disorder), combined type 07/29/2006   Social History   Tobacco Use  . Smoking status: Never Smoker  . Smokeless tobacco: Never Used  Substance Use Topics  . Alcohol use: No  . Drug use: No   Current Medications and Allergies:   .  Cholecalciferol (VITAMIN D PO), Take by mouth daily., Disp: , Rfl:  .  FLUoxetine (PROZAC) 10 MG tablet, Take 1 tablet (10 mg total) by mouth daily., Disp: 90 tablet, Rfl: 3 .  IRON PO, Take by mouth daily., Disp: , Rfl:  .  Levonorgestrel (MIRENA, 52 MG, IU), by Intrauterine route., Disp: , Rfl:  .  propranolol (INDERAL) 10 MG tablet, Take 1 tablet (10 mg total) by mouth daily as needed. One hour prior to appointment, Disp: 30 tablet, Rfl: 5   Allergies  Allergen Reactions  . Azithromycin     Nausea ( z-pack )   Review of Systems   Pertinent items are noted in the HPI. Otherwise, ROS is negative.  Vitals:   Vitals:   10/30/17 1107  BP: 110/72  Pulse: 81  Temp: 98.2 F (36.8 C)  TempSrc: Oral  SpO2: 97%  Weight: 192 lb 12.8 oz (87.5 kg)  Height: 5\' 7"  (1.702 m)     Body mass index is 30.2 kg/m.  Physical Exam:   Physical Exam  Constitutional:  She appears well-nourished.  HENT:  Head: Normocephalic and atraumatic.  Eyes: Pupils are equal, round, and reactive to light. EOM are normal.  Neck: Normal range of motion. Neck supple.  Cardiovascular: Normal rate, regular rhythm, normal heart sounds and intact distal pulses.  Pulmonary/Chest: Effort normal.  Abdominal: Soft.  Skin: Skin is warm.  Psychiatric: She has a normal mood and affect. Her behavior is normal.  Nursing note and vitals reviewed.  Results for orders placed or performed in visit on 10/13/17  Insulin, random  Result Value Ref Range   INSULIN 12.9 2.6 - 24.9 uIU/mL   Note: Patient states that insulin was drawn when fasting, so actually fasting insulin  level.  Assessment and Plan:   Misty Sanchez was seen today for obesity.  Diagnoses and all orders for this visit:  Weight gain Comments: Signs of hypothyroidism: none. Signs of hypercortisolism: none. Contraindications to weight loss: none. Patient readiness to commit to diet and activity changes: excellent. Barriers to weight loss: none.  Plan: 1. Diagnostic studies to rule out secondary causes of obesity: already completed. 2. General patient education:   Average sustained weight loss in long-term studies w/lifestyle interventions alone is 10-15 lb.  Importance of long-term maintenance tx in weight loss.  Use non-food self-rewards to reinforce behavior changes.  Elicit support from others; identify saboteurs.  Practical target weight is usually around 2 BMI units below current weight. 3. Diet interventions:   Risks of dieting were reviewed, including fatigue, temporary hair loss, gallstone formation, gout, and with very low calorie diets, electrolyte abnormalities, nutrient inadequacies, and loss of lean body mass. 4. Exercise intervention:   Informal measures, e.g. taking stairs instead of elevator.  Formal exercise regimen options. 5. Other behavioral treatment: stress management. 6. Other treatment: Medication: see orders below. 7. Patient to keep a weight log that we will review at follow up. 8. Follow up: 4-6 weeks and as needed.  Orders: -     metFORMIN (GLUCOPHAGE XR) 750 MG 24 hr tablet; Take 1 tablet (750 mg total) by mouth 2 (two) times daily. -     phentermine (ADIPEX-P) 37.5 MG tablet; Take 1 tablet (37.5 mg total) by mouth daily before breakfast.  Insulin resistance, with insulin level > 5, will trial Metformin as above  ADHD (attention deficit hyperactivity disorder), combined type Comments: I suspect that the patient will do well with phentermine, a sympathomimetic. My future plan is to replace with a stimulant to help her ADHD and impulsivity (overeating  sweets). Another option is to add Wellbutrin (off label for ADHD and will help with cravings). Will be careful with Dx of GAD, though.    . Reviewed expectations re: course of current medical issues. . Discussed self-management of symptoms. . Outlined signs and symptoms indicating need for more acute intervention. . Patient verbalized understanding and all questions were answered. Marland Kitchen Health Maintenance issues including appropriate healthy diet, exercise, and smoking avoidance were discussed with patient. . See orders for this visit as documented in the electronic medical record. . Patient received an After Visit Summary.  Helane Rima, DO [place] 10/30/2017

## 2017-11-30 ENCOUNTER — Ambulatory Visit: Payer: BC Managed Care – PPO | Admitting: Family Medicine

## 2017-11-30 ENCOUNTER — Encounter: Payer: Self-pay | Admitting: Family Medicine

## 2017-11-30 VITALS — BP 112/82 | HR 103 | Temp 98.3°F | Ht 67.0 in | Wt 181.2 lb

## 2017-11-30 DIAGNOSIS — F411 Generalized anxiety disorder: Secondary | ICD-10-CM | POA: Diagnosis not present

## 2017-11-30 NOTE — Progress Notes (Signed)
Patient: Misty Sanchez MRN: [id number] DOB: [birth year] PCP: Orland Mustard, MD     Subjective:  Chief Complaint  Patient presents with  . Anxiety    1 month follow up    HPI: The patient is a 37 y.o. female who presents today for anxiety after we increased her prozac to 20mg  a month ago. She actually left it at 10mg  as she thought her anxiety was due to her weight issues. She is on phentermine and metformin. She has lost 9 pounds and feels like her anxiety is much better due to understanding her weight gain. She is really happy with her progress with her weight loss. She feels like her anxiety is well controlled on the 10mg . She is not interested in increasing her dosage.   Review of Systems  Constitutional: Positive for fatigue.  Respiratory: Negative for shortness of breath.   Cardiovascular: Negative for chest pain.  Gastrointestinal: Negative for abdominal pain and nausea.  Psychiatric/Behavioral: Negative for decreased concentration and dysphoric mood. The patient is not nervous/anxious.     Allergies Patient is allergic to azithromycin.  Past Medical History Patient  has a past medical history of ADHD (attention deficit hyperactivity disorder), Anxiety, Frequent UTI, Hyperlipidemia, and IC (interstitial cystitis).  Surgical History Patient  has a past surgical history that includes Wisdom tooth extraction and Tympanostomy tube placement (Bilateral, 03/2013).  Family History Pateint's family history includes Cancer in her maternal grandmother; Celiac disease in her father; Diabetes in her paternal uncle; Drug abuse in her paternal uncle; Hypertension in her mother.  Social History Patient  reports that she has never smoked. She has never used smokeless tobacco. She reports that she does not drink alcohol or use drugs.    Objective: Vitals:   11/30/17 0931  BP: 112/82  Pulse: (!) 103  Temp: 98.3 F (36.8 C)  TempSrc: Oral  SpO2: 96%  Weight: 181 lb 3.2 oz (82.2  kg)  Height: 5\' 7"  (1.702 m)    Body mass index is 28.38 kg/m.  Physical Exam  Constitutional: She appears well-developed and well-nourished.  Eyes: Pupils are equal, round, and reactive to light. EOM are normal.  Neck: Normal range of motion. Neck supple. No thyromegaly present.  Cardiovascular: Normal rate, regular rhythm and normal heart sounds.  Pulmonary/Chest: Effort normal and breath sounds normal.  Abdominal: Soft. Bowel sounds are normal.  Lymphadenopathy:    She has no cervical adenopathy.  Psychiatric: She has a normal mood and affect. Her behavior is normal.  No si/hi/ah/vh   Vitals reviewed.      GAD 7 : Generalized Anxiety Score 11/30/2017 10/29/2017  Nervous, Anxious, on Edge 1 2  Control/stop worrying 1 2  Worry too much - different things 1 2  Trouble relaxing 1 1  Restless 0 1  Easily annoyed or irritable 2 2  Afraid - awful might happen 1 0  Total GAD 7 Score 7 10  Anxiety Difficulty Not difficult at all Somewhat difficult     Assessment/plan: 1. GAD (generalized anxiety disorder) Never increased dosage and does not want to do this. Feels like her anxiety is well controlled now that her weight issues have been answered and she is losing weight. This was her trigger. GAD7 score with significant improvement. Will continue the 10 mg dosage at this time. She already exercises on a regular basis and is followed by Dr. Earlene Plater for weight loss. Will follow up with me as needed.     Return if symptoms worsen or fail  to improve.    Orland MustardAllison Blia Totman, MD [place] [facility]   11/30/2017

## 2017-12-15 ENCOUNTER — Ambulatory Visit: Payer: BC Managed Care – PPO | Admitting: Family Medicine

## 2017-12-15 ENCOUNTER — Encounter: Payer: Self-pay | Admitting: Family Medicine

## 2017-12-15 VITALS — BP 118/80 | HR 68 | Temp 97.6°F | Ht 67.0 in | Wt 176.8 lb

## 2017-12-15 DIAGNOSIS — E782 Mixed hyperlipidemia: Secondary | ICD-10-CM | POA: Diagnosis not present

## 2017-12-15 DIAGNOSIS — R635 Abnormal weight gain: Secondary | ICD-10-CM | POA: Diagnosis not present

## 2017-12-15 DIAGNOSIS — E8881 Metabolic syndrome: Secondary | ICD-10-CM

## 2017-12-15 DIAGNOSIS — E88819 Insulin resistance, unspecified: Secondary | ICD-10-CM

## 2017-12-15 MED ORDER — PHENTERMINE HCL 37.5 MG PO TABS: 37.5000 mg | ORAL_TABLET | Freq: Every day | ORAL | 1 refills | Status: DC

## 2017-12-15 MED ORDER — METFORMIN HCL ER 750 MG PO TB24: 750.0000 mg | ORAL_TABLET | Freq: Two times a day (BID) | ORAL | 1 refills | Status: DC

## 2017-12-15 NOTE — Progress Notes (Signed)
Misty Sanchez is a 37 y.o. female is here for follow up.  History of Present Illness:   Misty MortJoEllen Sanchez, Misty Sanchez acting as scribe for Dr. Helane RimaErica Deovion Sanchez.    HPI: Patient in office for follow up on medications. She was started on Metformin 750 BID and Phentermine 37.5mg . She did have some nausea but no vomiting for the first month. First visit was on 10/18 her weight was 192 today she is at 181lbs. She is down 16lbs. She is exercising 5 times a week for about 30 minutes to and hour. She has been working on portion control and healthy choices.   There are no preventive care reminders to display for this patient.   Depression screen [facility] 2/9 10/29/2017 09/29/2017  Decreased Interest 1 0  Down, Depressed, Hopeless 1 0  PHQ - 2 Score 2 0  Altered sleeping 1 -  Tired, decreased energy 3 -  Change in appetite 0 -  Feeling bad or failure about yourself  1 -  Trouble concentrating 1 -  Moving slowly or fidgety/restless 0 -  Suicidal thoughts 0 -  PHQ-9 Score 8 -  Difficult doing work/chores Somewhat difficult -   PMHx, SurgHx, SocialHx, FamHx, Medications, and Allergies were reviewed in the Visit Navigator and updated as appropriate.   Patient Active Problem List   Diagnosis Date Noted  . Polycystic ovary syndrome 10/30/2017  . GAD (generalized anxiety disorder) 10/29/2017  . Dyslipidemia 09/21/2017  . ADHD (attention deficit hyperactivity disorder), combined type 07/29/2006   Social History   Tobacco Use  . Smoking status: Never Smoker  . Smokeless tobacco: Never Used  Substance Use Topics  . Alcohol use: No  . Drug use: No   Current Medications and Allergies:   .  Cholecalciferol (VITAMIN D PO), Take by mouth daily., Disp: , Rfl:  .  FLUoxetine (PROZAC) 10 MG tablet, Take 1 tablet (10 mg total) by mouth daily., Disp: 90 tablet, Rfl: 3 .  IRON PO, Take by mouth daily., Disp: , Rfl:  .  Levonorgestrel (MIRENA, 52 MG, IU), by Intrauterine route., Disp: , Rfl:  .  metFORMIN  (GLUCOPHAGE XR) 750 MG 24 hr tablet, Take 1 tablet (750 mg total) by mouth 2 (two) times daily., Disp: 60 tablet, Rfl: 1 .  phentermine (ADIPEX-P) 37.5 MG tablet, Take 1 tablet (37.5 mg total) by mouth daily before breakfast., Disp: 30 tablet, Rfl: 1 .  propranolol (INDERAL) 10 MG tablet, Take 1 tablet (10 mg total) by mouth daily as needed. One hour prior to appointment, Disp: 30 tablet, Rfl: 5   Allergies  Allergen Reactions  . Azithromycin     Nausea ( z-pack )   Review of Systems   Pertinent items are noted in the HPI. Otherwise, a complete ROS is negative.  Vitals:   Vitals:   12/15/17 1008  BP: 118/80  Pulse: 68  Temp: 97.6 F (36.4 C)  TempSrc: Oral  SpO2: 96%  Weight: 176 lb 12.8 oz (80.2 kg)  Height: 5\' 7"  (1.702 m)     Body mass index is 27.69 kg/m.  Physical Exam:   Physical Exam  Constitutional: She appears well-nourished.  HENT:  Head: Normocephalic and atraumatic.  Eyes: Pupils are equal, round, and reactive to light. EOM are normal.  Neck: Normal range of motion. Neck supple.  Cardiovascular: Normal rate, regular rhythm, normal heart sounds and intact distal pulses.  Pulmonary/Chest: Effort normal.  Abdominal: Soft.  Skin: Skin is warm.  Psychiatric: She has a normal  mood and affect. Her behavior is normal.  Nursing note and vitals reviewed.  Results for orders placed or performed in visit on 10/13/17  Insulin, random  Result Value Ref Range   INSULIN 12.9 2.6 - 24.9 uIU/mL   Assessment and Plan:   Omah was seen today for follow-up.  Diagnoses and all orders for this visit:  Insulin resistance Comments: Tolerating Metformin. Continue.  Weight gain Comments: Patient is doing very well. Down nearly 20 pounds. Exercising most days. Eating well. Tolerating meds. Will recheck 3 months. Orders: -     phentermine (ADIPEX-P) 37.5 MG tablet; Take 1 tablet (37.5 mg total) by mouth daily before breakfast. -     metFORMIN (GLUCOPHAGE XR) 750 MG  24 hr tablet; Take 1 tablet (750 mg total) by mouth 2 (two) times daily.  Mixed hyperlipidemia Comments: To be rechecked with PCP next year.   . Orders and follow up as documented in EpicCare, reviewed diet, exercise and weight control, cardiovascular risk and specific lipid/LDL goals reviewed, reviewed medications and side effects in detail.  . Reviewed expectations re: course of current medical issues. . Outlined signs and symptoms indicating need for more acute intervention. . Patient verbalized understanding and all questions were answered. . Patient received an After Visit Summary.  Misty Sanchez served as Neurosurgeon during this visit. History, Physical, and Plan performed by medical provider. The above documentation has been reviewed and is accurate and complete. Misty Sanchez, D.O.  Misty Rima, DO [place] 12/15/2017

## 2017-12-16 ENCOUNTER — Other Ambulatory Visit: Payer: Self-pay | Admitting: Family Medicine

## 2017-12-16 NOTE — Telephone Encounter (Signed)
See note

## 2017-12-16 NOTE — Telephone Encounter (Signed)
Please call to see what the issue is. I had a problem when Rx due to computer. I believe that I printed one Rx and sent one. Okay 90 day refill.

## 2017-12-16 NOTE — Telephone Encounter (Signed)
Copied from CRM [id number]. Topic: Quick Communication - Rx Refill/Question >> Dec 16, 2017 11:59 AM Crist InfanteHarrald, Kathy J wrote: Medication: phentermine (ADIPEX-P) 37.5 MG tablet  Pt states she took this Rx to her pharmacy and they will not fill until the dr office call them to confirm.  Pt very confused, and states the paper Rx is just sitting on the desk at the pharmacy. Pt needs someone to call them. Pt states Dr Earlene PlaterWallace was to put 2 refills on this Rx and it only had one. Pt states she was to have 90 days to get her through to her next appt. So would like you to add the refill when you call the pharmacy [facility] [address] - [place], [place] - [id number] [phone] (Phone) [phone] (Fax)

## 2017-12-16 NOTE — Telephone Encounter (Signed)
[facility] at [facility] rx is being filled as we spoke on the phone.  She stated that there was no documentation of any issue w/rx.

## 2017-12-16 NOTE — Telephone Encounter (Signed)
Pls see msg.  Dr. Earlene PlaterWallace prescribed this for her

## 2017-12-18 ENCOUNTER — Other Ambulatory Visit: Payer: BC Managed Care – PPO

## 2017-12-24 ENCOUNTER — Telehealth: Payer: Self-pay

## 2017-12-24 NOTE — Telephone Encounter (Signed)
Dorismar Heumann Key: [id number] - Rx #: [id number] Need help? Call us at [phone]  Outcome  Additional Information Required  Your PA has been resolved, no additional PA is required. For further inquiries please contact the number on the back of the member prescription card. (Message [phone])  DrugPhentermine HCl 37.5MG  tablets  [employer] Electronic PA Form (NCPDP)  Original Claim Info75 PRIOR AUTH REQ-MD CALL [phone].DRUG REQUIRES PRIOR AUTHORIZATION  Form received for more information via fax have filled out and sent in.

## 2017-12-25 ENCOUNTER — Ambulatory Visit: Payer: BC Managed Care – PPO | Admitting: Family Medicine

## 2017-12-25 NOTE — Telephone Encounter (Signed)
auth received 12/25/17

## 2018-02-12 ENCOUNTER — Other Ambulatory Visit: Payer: Self-pay | Admitting: Family Medicine

## 2018-02-12 DIAGNOSIS — R635 Abnormal weight gain: Secondary | ICD-10-CM

## 2018-02-12 MED ORDER — PHENTERMINE HCL 37.5 MG PO TABS: 37.5000 mg | ORAL_TABLET | Freq: Every day | ORAL | 1 refills | Status: DC

## 2018-02-12 MED ORDER — METFORMIN HCL ER 750 MG PO TB24: 750.0000 mg | ORAL_TABLET | Freq: Two times a day (BID) | ORAL | 0 refills | Status: DC

## 2018-02-12 NOTE — Telephone Encounter (Signed)
Requested medication (s) are due for refill today: yes  Requested medication (s) are on the active medication list: yes  Last refill:  12/16/17  Future visit scheduled: yes  Notes to clinic:  Not delegated    Requested Prescriptions  Pending Prescriptions Disp Refills   phentermine (ADIPEX-P) 37.5 MG tablet 30 tablet 1    Sig: Take 1 tablet (37.5 mg total) by mouth daily before breakfast.     Not Delegated - Gastroenterology:  Antiobesity Agents Failed - 02/12/2018 12:01 PM      Failed - This refill cannot be delegated      Passed - Last BP in normal range    BP Readings from Last 1 Encounters:  12/15/17 118/80         Passed - Last Heart Rate in normal range    Pulse Readings from Last 1 Encounters:  12/15/17 68         Passed - Valid encounter within last 12 months    Recent Outpatient Visits          1 month ago Insulin resistance   [place] Wallace, [place], DO   2 months ago GAD (generalized anxiety disorder)   [facility] Solomons, Ebony Hail, MD   3 months ago Weight gain   [place] Wallace, [place], DO   3 months ago Mixed hyperlipidemia   [facility] Solomons, Ebony Hail, MD   4 months ago Dyslipidemia   Therapist, [facility] at [employer], Ishmael Holter, MD      Future Appointments            In 1 month Briscoe Deutscher, DO [place], [facility]         Signed Prescriptions Disp Refills   metFORMIN (GLUCOPHAGE XR) 750 MG 24 hr tablet 120 tablet 0    Sig: Take 1 tablet (750 mg total) by mouth 2 (two) times daily.     Endocrinology:  Diabetes - Biguanides Passed - 02/12/2018 12:01 PM      Passed - Cr in normal range and within 360 days    Creat  Date Value Ref Range Status  01/09/2015 0.74 0.50 - 1.10 mg/dL Final   Creatinine, Ser  Date Value Ref Range Status  08/25/2017 0.81 0.57 - 1.00 mg/dL Final         Passed - HBA1C is between 0 and 7.9  and within 180 days    Hgb A1c MFr Bld  Date Value Ref Range Status  08/25/2017 5.2 4.8 - 5.6 % Final    Comment:             Prediabetes: 5.7 - 6.4          Diabetes: >6.4          Glycemic control for adults with diabetes: <7.0          Passed - eGFR in normal range and within 360 days    GFR calc Af Amer  Date Value Ref Range Status  08/25/2017 110 >59 mL/min/1.73 Final   GFR calc non Af Amer  Date Value Ref Range Status  08/25/2017 95 >59 mL/min/1.73 Final   GFR  Date Value Ref Range Status  11/02/2015 84.10 >60.00 mL/min Final         Passed - Valid encounter within last 6 months    Recent Outpatient Visits          1 month ago Insulin resistance   [facility]  [place], DO   2 months ago GAD (generalized anxiety disorder)   [facility] Solomons, Ebony Hail, MD   3 months ago Weight gain   [place] Wallace, [place], DO   3 months ago Mixed hyperlipidemia   [facility] Sol[facility], Ebony Hail, MD   4 months ago Dyslipidemia   Therapist, music at [employer], Ishmael Holter, MD      Future Appointments            In 1 month Briscoe Deutscher, [place], [place]

## 2018-02-12 NOTE — Telephone Encounter (Signed)
Copied from CRM [id number]. Topic: Quick Communication - Rx Refill/Question >> Feb 12, 2018 11:55 AM Angela Nevin wrote: Medication: phentermine (ADIPEX-P) 37.5 MG tablet and metFORMIN (GLUCOPHAGE XR) 750 MG 24 hr tablet   Patient is requesting refill of this medication.   Preferred Pharmacy (with phone number or street name):[facility] [address] [place] - [address]  [phone] (Phone) [phone] (Fax)

## 2018-02-12 NOTE — Telephone Encounter (Signed)
Sending to prescribing physician.

## 2018-02-12 NOTE — Telephone Encounter (Signed)
Requested Prescriptions  Pending Prescriptions Disp Refills  . metFORMIN (GLUCOPHAGE XR) 750 MG 24 hr tablet 120 tablet 0    Sig: Take 1 tablet (750 mg total) by mouth 2 (two) times daily.     Endocrinology:  Diabetes - Biguanides Passed - 02/12/2018 12:01 PM      Passed - Cr in normal range and within 360 days    Creat  Date Value Ref Range Status  01/09/2015 0.74 0.50 - 1.10 mg/dL Final   Creatinine, Ser  Date Value Ref Range Status  08/25/2017 0.81 0.57 - 1.00 mg/dL Final         Passed - HBA1C is between 0 and 7.9 and within 180 days    Hgb A1c MFr Bld  Date Value Ref Range Status  08/25/2017 5.2 4.8 - 5.6 % Final    Comment:             Prediabetes: 5.7 - 6.4          Diabetes: >6.4          Glycemic control for adults with diabetes: <7.0          Passed - eGFR in normal range and within 360 days    GFR calc Af Amer  Date Value Ref Range Status  08/25/2017 110 >59 mL/min/1.73 Final   GFR calc non Af Amer  Date Value Ref Range Status  08/25/2017 95 >59 mL/min/1.73 Final   GFR  Date Value Ref Range Status  11/02/2015 84.10 >60.00 mL/min Final         Passed - Valid encounter within last 6 months    Recent Outpatient Visits          1 month ago Insulin resistance   [place] Wallace, [place], DO   2 months ago GAD (generalized anxiety disorder)   [facility] Jacqualine Code, MD   3 months ago Weight gain   [place] Wallace, [place], DO   3 months ago Mixed hyperlipidemia   [facility] Jacqualine Code, MD   4 months ago Dyslipidemia   Therapist, music at [employer], Ishmael Holter, MD      Future Appointments            In 1 month Briscoe Deutscher, [place], [place]         . phentermine (ADIPEX-P) 37.5 MG tablet 30 tablet 1    Sig: Take 1 tablet (37.5 mg total) by mouth daily before breakfast.     Not Delegated - Gastroenterology:   Antiobesity Agents Failed - 02/12/2018 12:01 PM      Failed - This refill cannot be delegated      Passed - Last BP in normal range    BP Readings from Last 1 Encounters:  12/15/17 118/80         Passed - Last Heart Rate in normal range    Pulse Readings from Last 1 Encounters:  12/15/17 68         Passed - Valid encounter within last 12 months    Recent Outpatient Visits          1 month ago Insulin resistance   [place] Wallace, [place], DO   2 months ago GAD (generalized anxiety disorder)   [facility] Solomons, Ebony Hail, MD   3 months ago Weight gain   [place] Wallace, [place], DO   3 months ago Mixed hyperlipidemia  [facility] Ar[facility] Solomons, Ebony Hail, MD   4 months ago Dyslipidemia   Therapist, music at [employer], Ishmael Holter, MD      Future Appointments            In 1 month Briscoe Deutscher, [place], [place]

## 2018-02-12 NOTE — Telephone Encounter (Signed)
See note

## 2018-02-15 ENCOUNTER — Other Ambulatory Visit (INDEPENDENT_AMBULATORY_CARE_PROVIDER_SITE_OTHER): Payer: BC Managed Care – PPO

## 2018-02-15 DIAGNOSIS — E782 Mixed hyperlipidemia: Secondary | ICD-10-CM | POA: Diagnosis not present

## 2018-02-15 LAB — COMPREHENSIVE METABOLIC PANEL
ALT: 18 U/L (ref 0–35)
AST: 13 U/L (ref 0–37)
Albumin: 3.9 g/dL (ref 3.5–5.2)
Alkaline Phosphatase: 48 U/L (ref 39–117)
BUN: 19 mg/dL (ref 6–23)
CHLORIDE: 105 meq/L (ref 96–112)
CO2: 28 mEq/L (ref 19–32)
Calcium: 9.2 mg/dL (ref 8.4–10.5)
Creatinine, Ser: 0.78 mg/dL (ref 0.40–1.20)
GFR: 83.87 mL/min (ref >60.00)
Glucose, Bld: 89 mg/dL (ref 70–99)
POTASSIUM: 4.5 meq/L (ref 3.5–5.1)
SODIUM: 139 meq/L (ref 135–145)
Total Bilirubin: 0.3 mg/dL (ref 0.2–1.2)
Total Protein: 5.5 g/dL — ABNORMAL LOW (ref 6.0–8.3)

## 2018-02-15 LAB — LIPID PANEL
Cholesterol: 211 mg/dL — ABNORMAL HIGH (ref 0–200)
HDL: 42.7 mg/dL (ref >39.00)
LDL CALC: 151 mg/dL — AB (ref 0–99)
NonHDL: 167.81
Total CHOL/HDL Ratio: 5
Triglycerides: 86 mg/dL (ref 0.0–149.0)
VLDL: 17.2 mg/dL (ref 0.0–40.0)

## 2018-02-15 LAB — HIGH SENSITIVITY CRP: CRP, High Sensitivity: 0.7 mg/L (ref 0.000–5.000)

## 2018-02-20 ENCOUNTER — Encounter: Payer: Self-pay | Admitting: Family Medicine

## 2018-02-20 DIAGNOSIS — R635 Abnormal weight gain: Secondary | ICD-10-CM

## 2018-02-22 MED ORDER — PHENTERMINE HCL 37.5 MG PO TABS: 37.5000 mg | ORAL_TABLET | Freq: Every day | ORAL | 0 refills | Status: DC

## 2018-02-22 MED ORDER — METFORMIN HCL ER 750 MG PO TB24: 750.0000 mg | ORAL_TABLET | Freq: Two times a day (BID) | ORAL | 0 refills | Status: DC

## 2018-02-22 NOTE — Telephone Encounter (Signed)
Ok to fill 

## 2018-03-17 ENCOUNTER — Ambulatory Visit: Payer: BC Managed Care – PPO | Admitting: Family Medicine

## 2018-03-17 ENCOUNTER — Encounter: Payer: Self-pay | Admitting: Family Medicine

## 2018-03-17 VITALS — BP 124/70 | HR 85 | Temp 97.5°F | Ht 67.0 in | Wt 178.8 lb

## 2018-03-17 DIAGNOSIS — R635 Abnormal weight gain: Secondary | ICD-10-CM

## 2018-03-17 DIAGNOSIS — E88819 Insulin resistance, unspecified: Secondary | ICD-10-CM

## 2018-03-17 DIAGNOSIS — M722 Plantar fascial fibromatosis: Secondary | ICD-10-CM

## 2018-03-17 DIAGNOSIS — E8881 Metabolic syndrome: Secondary | ICD-10-CM | POA: Diagnosis not present

## 2018-03-17 DIAGNOSIS — F902 Attention-deficit hyperactivity disorder, combined type: Secondary | ICD-10-CM

## 2018-03-17 MED ORDER — AMPHETAMINE-DEXTROAMPHET ER 20 MG PO CP24: 20.0000 mg | ORAL_CAPSULE | ORAL | 0 refills | Status: DC

## 2018-03-17 MED ORDER — METFORMIN HCL ER 750 MG PO TB24: 750.0000 mg | ORAL_TABLET | Freq: Two times a day (BID) | ORAL | 0 refills | Status: DC

## 2018-03-17 NOTE — Progress Notes (Signed)
Misty Sanchez is a 38 y.o. female is here for follow up.  History of Present Illness:   Misty Sanchez, CMA acting as scribe for Dr. Helane Rima  HPI:   ADHD. Previous Dx. Focus is an issue. She is better able to control impulsivity as an adult, but contributes to poor food choices.   Plantar fasciitis. Bilateral. Keeping her from exercise. She has tried stretching, ice, NSAIDs, orthotics.   Insulin resistance. Tolerating Metformin.   Weight gain Finding it hard to resist cravings for sweets, carbs.  Avoiding soft drinks, etc, feels like she has hit a plateau with her weight loss. Exercising most days. Eating well. Tolerating meds. She is currently on phentermine 37.5 MG table and metformin 750 MG 24 hr tablet.   Usual eating pattern includes: The patient eats a regular, healthy diet.. Usual physical activity includes walking 1 hours a day 4 days per week. Current life stressors: none.  Depression screen [facility] 2/9 03/17/2018 10/29/2017 09/29/2017  Decreased Interest 0 1 0  Down, Depressed, Hopeless 0 1 0  PHQ - 2 Score 0 2 0  Altered sleeping 2 1 -  Tired, decreased energy 2 3 -  Change in appetite 1 0 -  Feeling bad or failure about yourself  0 1 -  Trouble concentrating 1 1 -  Moving slowly or fidgety/restless 0 0 -  Suicidal thoughts 0 0 -  PHQ-9 Score 6 8 -  Difficult doing work/chores Somewhat difficult Somewhat difficult -   PMHx, SurgHx, SocialHx, FamHx, Medications, and Allergies were reviewed in the Visit Navigator and updated as appropriate.   Patient Active Problem List   Diagnosis Date Noted  . GAD (generalized anxiety disorder) 10/29/2017  . Dyslipidemia 09/21/2017  . ADHD (attention deficit hyperactivity disorder), combined type 07/29/2006   Social History   Tobacco Use  . Smoking status: Never Smoker  . Smokeless tobacco: Never Used  Substance Use Topics  . Alcohol use: No  . Drug use: No   Current Medications and Allergies   .   Cholecalciferol (VITAMIN D PO), Take by mouth daily., Disp: , Rfl:  .  FLUoxetine (PROZAC) 10 MG tablet, Take 1 tablet (10 mg total) by mouth daily., Disp: 90 tablet, Rfl: 3 .  IRON PO, Take by mouth daily., Disp: , Rfl:  .  Levonorgestrel (MIRENA, 52 MG, IU), by Intrauterine route., Disp: , Rfl:  .  metFORMIN (GLUCOPHAGE XR) 750 MG 24 hr tablet, Take 1 tablet (750 mg total) by mouth 2 (two) times daily., Disp: 120 tablet, Rfl: 0 .  phentermine (ADIPEX-P) 37.5 MG tablet, Take 1 tablet (37.5 mg total) by mouth daily before breakfast., Disp: 30 tablet, Rfl: 0   Allergies  Allergen Reactions  . Azithromycin     Nausea ( z-pack )   Review of Systems   Pertinent items are noted in the HPI. Otherwise, a complete ROS is negative.  Vitals   Vitals:   03/17/18 1005  BP: 124/70  Pulse: 85  Temp: (!) 97.5 F (36.4 C)  TempSrc: Oral  SpO2: 98%  Weight: 178 lb 12.8 oz (81.1 kg)  Height: 5\' 7"  (1.702 m)     Body mass index is 28 kg/m.  Physical Exam   Physical Exam Vitals signs and nursing note reviewed.  Constitutional:      General: She is not in acute distress.    Appearance: She is well-developed.  HENT:     Head: Normocephalic and atraumatic.     Right  Ear: External ear normal.     Left Ear: External ear normal.     Nose: Nose normal.  Eyes:     Conjunctiva/sclera: Conjunctivae normal.     Pupils: Pupils are equal, round, and reactive to light.  Neck:     Musculoskeletal: Normal range of motion and neck supple.     Thyroid: No thyromegaly.  Cardiovascular:     Rate and Rhythm: Normal rate and regular rhythm.     Heart sounds: Normal heart sounds.  Pulmonary:     Effort: Pulmonary effort is normal.     Breath sounds: Normal breath sounds.  Abdominal:     General: Bowel sounds are normal.     Palpations: Abdomen is soft.  Musculoskeletal: Normal range of motion.  Lymphadenopathy:     Cervical: No cervical adenopathy.  Skin:    General: Skin is warm and dry.      Capillary Refill: Capillary refill takes less than 2 seconds.  Neurological:     Mental Status: She is alert and oriented to person, place, and time.  Psychiatric:        Behavior: Behavior normal.    Assessment and Plan   Misty Sanchez was seen today for 3 mo weight loss f/u.  Diagnoses and all orders for this visit:  ADHD (attention deficit hyperactivity disorder), combined type -     amphetamine-dextroamphetamine (ADDERALL XR) 20 MG 24 hr capsule; Take 1 capsule (20 mg total) by mouth every morning.  Insulin resistance  Weight gain Comments: Patient is doing very well. Down nearly 20 pounds. Exercising most days. Eating well. Tolerating meds. Will recheck 3 months. Orders: -     metFORMIN (GLUCOPHAGE XR) 750 MG 24 hr tablet; Take 1 tablet (750 mg total) by mouth 2 (two) times daily.  Plantar fasciitis of right foot -     Ambulatory referral to Sports Medicine    . Orders and follow up as documented in EpicCare, reviewed diet, exercise and weight control, cardiovascular risk and specific lipid/LDL goals reviewed, reviewed medications and side effects in detail.  . Reviewed expectations re: course of current medical issues. . Outlined signs and symptoms indicating need for more acute intervention. . Patient verbalized understanding and all questions were answered. . Patient received an After Visit Summary.  Misty Sanchez, CMA served as Neurosurgeon during this visit. History, Physical, and Plan performed by medical provider. The above documentation has been reviewed and is accurate and complete. Helane Rima, D.O.  Helane Rima, DO [place] [place] 03/21/2018

## 2018-03-18 ENCOUNTER — Telehealth: Payer: Self-pay | Admitting: Family Medicine

## 2018-03-18 ENCOUNTER — Ambulatory Visit: Payer: Self-pay

## 2018-03-18 ENCOUNTER — Ambulatory Visit: Payer: BC Managed Care – PPO | Admitting: Sports Medicine

## 2018-03-18 ENCOUNTER — Encounter: Payer: Self-pay | Admitting: Sports Medicine

## 2018-03-18 VITALS — BP 106/78 | HR 91 | Ht 67.0 in | Wt 178.2 lb

## 2018-03-18 DIAGNOSIS — M79672 Pain in left foot: Secondary | ICD-10-CM | POA: Diagnosis not present

## 2018-03-18 DIAGNOSIS — M722 Plantar fascial fibromatosis: Secondary | ICD-10-CM

## 2018-03-18 DIAGNOSIS — M79671 Pain in right foot: Secondary | ICD-10-CM | POA: Diagnosis not present

## 2018-03-18 DIAGNOSIS — Q667 Congenital pes cavus, unspecified foot: Secondary | ICD-10-CM | POA: Diagnosis not present

## 2018-03-18 NOTE — Telephone Encounter (Signed)
JoEllen, have you received a PA for this pt for Adderall?

## 2018-03-18 NOTE — Telephone Encounter (Signed)
Patient came in office stating her Rx for amphetamine-dextroamphetamine (ADDERALL XR) 20 MG 24 hr capsule was denied  Because insurance was not going to pay for it. Patient wanted to confirm that our office received that note and that it is being looked into. Please advise.

## 2018-03-18 NOTE — Patient Instructions (Signed)
Please perform the exercise program that we have prepared for you and gone over in detail on a daily basis.  In addition to the handout you were provided you can access your program through: [email or web address]   Your unique program code is:  [email or web address]   Frequent icing is recommended.  You can ice for 10-15 minutes at a time 3-4 times per day if needed.  It is important to be sure to ice immediately after activity. For the foot/ankle and hand/wrist it is sometimes easier and more effective to soak in a bucket of cool water.   The water should not be miserably cold, a good rule to go by his if there is any ice floating by the end of the 10-15 minutes it was likely too cold.

## 2018-03-18 NOTE — Telephone Encounter (Signed)
Copied from CRM [id number]. Topic: General - Other >> Mar 18, 2018 12:36 PM Jaquita Rector A wrote: Reason for CRM: Patient called to say that pharmacy informed her that Rx need Prior authorization for amphetamine-dextroamphetamine (ADDERALL XR) 20 MG 24 hr capsule and that they are charging her $200 if she pay out of pocket. Patient is asking for Prior authorization to be completed please. Cb# [phone]

## 2018-03-18 NOTE — Progress Notes (Signed)
Misty Sanchez. Misty Sanchez [facility] [phone]  Misty Sanchez Misty Sanchez - 38 y.o. female MRN [id number]  Date of birth: 1980/11/30  Visit Date: March 21, 2018  PCP: Misty Mustard, MD   Referred by: Misty Rima, DO  SUBJECTIVE:  Chief Complaint  Patient presents with  . New Patient (Initial Visit)    R plantar foot pain.  Ref by Misty Sanchez    HPI: Patient is here for worsening right heel pain that began in early January 2020.  She has had issues in both feet for quite some time but this is been exacerbated quickly.  She describes moderate to severe pain that is sharp stabbing along the medial aspect of the calcaneus and into the proximal plantar fashion.  Icing and rolling her foot has not provided significant improvement.  She is tried Spenco inserts without improvement as well.  REVIEW OF SYSTEMS: No significant nighttime awakenings due to this issue. Denies fevers, chills, recent weight gain or weight loss.  No night sweats.  Pt denies any change in bowel or bladder habits, muscle weakness, numbness or falls associated with this pain. Otherwise 12 point review of systems performed and is negative   HISTORY:  Prior history reviewed and updated per electronic medical record.  Patient Active Problem List   Diagnosis Date Noted  . GAD (generalized anxiety disorder) 10/29/2017    Started on prozac in 01/2016   . Dyslipidemia 09/21/2017  . ADHD (attention deficit hyperactivity disorder), combined type 07/29/2006    No longer on medication. Does not need anymore.      Social History   Occupational History  . Not on file  Tobacco Use  . Smoking status: Never Smoker  . Smokeless tobacco: Never Used  Substance and Sexual Activity  . Alcohol use: No  . Drug use: No  . Sexual activity: Not Currently    Partners: Male    Birth control/protection: I.U.D.    Comment: Mirena IUD    Social History   Social History Narrative  .  Not on file   Past Medical History:  Diagnosis Date  . ADHD (attention deficit hyperactivity disorder)   . Anxiety   . Frequent UTI   . Hyperlipidemia   . IC (interstitial cystitis)    Past Surgical History:  Procedure Laterality Date  . TYMPANOSTOMY TUBE PLACEMENT Bilateral 03/2013  . WISDOM TOOTH EXTRACTION     family history includes Cancer in her maternal grandmother; Celiac disease in her father; Diabetes in her paternal uncle; Drug abuse in her paternal uncle; Hypertension in her mother.  OBJECTIVE:  VS:  HT:5\' 7"  (170.2 cm)   WT:178 lb 3.2 oz (80.8 kg)  BMI:27.9    BP:106/78  HR:91bpm  TEMP: ( )  RESP:99 %   PHYSICAL EXAM: CONSTITUTIONAL: Well-developed, Well-nourished and In no acute distress EYES: Pupils are equal., EOM intact without nystagmus. and No scleral icterus. Psychiatric: Alert & appropriately interactive. and Not depressed or anxious appearing. EXTREMITY EXAM: Warm and well perfused  Right foot is overall well aligned she does have a moderately high cavus foot.  She has marked pain with palpation over the medial plantar fascia.  There is no appreciable fibromatous change.  Dorsiflexion to 95 degrees.    ASSESSMENT:   1. Bilateral foot pain   2. Plantar fasciitis of right foot   3. Plantar fasciitis   4. Cavus deformity of foot     PROCEDURES:  PROCEDURE NOTE: THERAPEUTIC EXERCISES (  61224)  Discussed the foundation of treatment for this condition is physical therapy and/or daily (5-6 days/week) therapeutic exercises, focusing on core strengthening, coordination, neuromuscular control/reeducation. 15 minutes spent for Therapeutic exercises as below and as referenced in the AVS. This included exercises focusing on stretching, strengthening, with significant focus on eccentric aspects.  Proper technique shown and discussed handout in great detail with ATC. All questions were discussed and answered.  Long term goals include an improvement in range of  motion, strength, endurance as well as avoiding reinjury. Frequency of visits is one time as determined during today's office visit. Frequency of exercises to be performed is as per handout. EXERCISES REVIEWED: [place] Protocol Plantar Fascia Stretch   PLAN:  Pertinent additional documentation may be included in corresponding procedure notes, imaging studies, problem based documentation and patient instructions.  No problem-specific Assessment & Plan notes found for this encounter.   Fairly classic plantar fasciitis.  We discussed the options for custom cushioned insoles, therapeutic injection, and activity modifications.   Home Therapeutic exercises prescribed today per procedure note.  RICE (Rest, ICE, Compression, Elevation) principles reviewed with the patient. Cool water soaking recommended per AVS.  Activity modifications and the importance of avoiding exacerbating activities (limiting pain to no more than a 4 / 10 during or following activity) recommended and discussed.  Discussed red flag symptoms that warrant earlier emergent evaluation and patient voices understanding.  No orders of the defined types were placed in this encounter.  Lab Orders  No laboratory test(s) ordered today    Imaging Orders     [place] [facility] POCT ULTRASOUND Referral Orders  No referral(s) requested today   Return in about 6 weeks (around 04/29/2018).          Misty Mews, DO    [facility] Phys[facility]

## 2018-03-18 NOTE — Telephone Encounter (Signed)
I did not px this, sending to DR. Earlene Plater.

## 2018-03-19 ENCOUNTER — Telehealth: Payer: Self-pay

## 2018-03-19 NOTE — Telephone Encounter (Signed)
Joellen, I think that this is your realm. Let me know if I need to change to immediate release until prior auth approval.

## 2018-03-19 NOTE — Telephone Encounter (Signed)
Prior auth started and new message started.

## 2018-03-19 NOTE — Telephone Encounter (Signed)
Misty Sanchez Key: [id number] - Rx #: [id number] Need help? Call us at [phone]  Status  Sent to [employer]  DrugAmphetamine-Dextroamphet ER 20MG  er capsules  [employer] Electronic PA Form (NCPDP)  Original Claim Info75 QTY LIMIT EXCD PA RQD [phone] REQUIRES PRIOR AUTHORIZATION

## 2018-03-21 NOTE — Procedures (Signed)
LIMITED MSK ULTRASOUND OF Right Plantar Fascia Images were obtained and interpreted by myself, Gaspar Bidding, DO  Images have been saved and stored to PACS system. Images obtained on: GE S7 Ultrasound machine  FINDINGS:   Right plantar fascia measures up to 0.48 cm  Left plantar fascial measures 0.2 cm  IMPRESSION:  1. Right plantar fasciitis

## 2018-03-30 ENCOUNTER — Other Ambulatory Visit (HOSPITAL_COMMUNITY)
Admission: RE | Admit: 2018-03-30 | Discharge: 2018-03-30 | Disposition: A | Discharge status: Home / Self Care | Payer: BC Managed Care – PPO | Source: Ambulatory Visit | Attending: Obstetrics & Gynecology | Admitting: Obstetrics & Gynecology

## 2018-03-30 ENCOUNTER — Encounter: Payer: Self-pay | Admitting: Certified Nurse Midwife

## 2018-03-30 ENCOUNTER — Ambulatory Visit (INDEPENDENT_AMBULATORY_CARE_PROVIDER_SITE_OTHER): Payer: BC Managed Care – PPO | Admitting: Certified Nurse Midwife

## 2018-03-30 ENCOUNTER — Other Ambulatory Visit: Payer: Self-pay

## 2018-03-30 VITALS — BP 110/76 | HR 64 | Temp 97.7°F | Resp 16 | Ht 67.0 in | Wt 177.0 lb

## 2018-03-30 DIAGNOSIS — Z124 Encounter for screening for malignant neoplasm of cervix: Secondary | ICD-10-CM | POA: Diagnosis not present

## 2018-03-30 DIAGNOSIS — F418 Other specified anxiety disorders: Secondary | ICD-10-CM

## 2018-03-30 DIAGNOSIS — Z8742 Personal history of other diseases of the female genital tract: Secondary | ICD-10-CM | POA: Diagnosis not present

## 2018-03-30 DIAGNOSIS — Z01419 Encounter for gynecological examination (general) (routine) without abnormal findings: Secondary | ICD-10-CM | POA: Diagnosis not present

## 2018-03-30 MED ORDER — FLUOXETINE HCL 10 MG PO TABS: 10.0000 mg | ORAL_TABLET | Freq: Every day | ORAL | 3 refills | Status: DC

## 2018-03-30 NOTE — Progress Notes (Signed)
38 y.o. G0P0000 Single  Caucasian Fe here for annual exam.  Periods scant to none with IUD. Sees Dr.Wolfe for aex and Dr.Wallace for weight management, has lost 16 pounds since last started working on. Being treated with Metformin/Inderal with Dr. Earlene Plater management.  Exercises daily with varied work out. Prozac still working well for anxiety and depression. Feeling so much better now. Not sexually active and does not plan to be.  No other health issues today.  No LMP recorded (lmp unknown). (Menstrual status: IUD).          Sexually active: No.  The current method of family planning is IUD.    Exercising: Yes.    cardio, strength training, spin class Smoker:  no  Review of Systems  Constitutional: Negative.   HENT: Negative.   Eyes: Negative.   Respiratory: Negative.   Cardiovascular: Negative.   Gastrointestinal: Negative.   Genitourinary: Negative.   Musculoskeletal: Negative.   Skin: Negative.   Neurological: Negative.   Endo/Heme/Allergies: Negative.   Psychiatric/Behavioral: Negative.     Health Maintenance: Pap:  01-11-16 neg, no endos History of Abnormal Pap: no MMG:  none Self Breast exams: occ Colonoscopy:  none BMD:   none TDaP:  2017 Shingles: no Pneumonia: no Hep C and HIV: not done Labs: with MD.   reports that she has never smoked. She has never used smokeless tobacco. She reports that she does not drink alcohol or use drugs.  Past Medical History:  Diagnosis Date  . ADHD (attention deficit hyperactivity disorder)   . Anxiety   . Frequent UTI   . Hyperlipidemia   . IC (interstitial cystitis)   . PCOS (polycystic ovarian syndrome)     Past Surgical History:  Procedure Laterality Date  . TYMPANOSTOMY TUBE PLACEMENT Bilateral 03/2013  . WISDOM TOOTH EXTRACTION      Current Outpatient Medications  Medication Sig Dispense Refill  . amphetamine-dextroamphetamine (ADDERALL XR) 20 MG 24 hr capsule Take 1 capsule (20 mg total) by mouth every morning. 30  capsule 0  . Cholecalciferol (VITAMIN D PO) Take by mouth daily.    Marland Kitchen FLUoxetine (PROZAC) 10 MG tablet Take 1 tablet (10 mg total) by mouth daily. 90 tablet 3  . IRON PO Take by mouth daily.    . Levonorgestrel (MIRENA, 52 MG, IU) by Intrauterine route.    . metFORMIN (GLUCOPHAGE XR) 750 MG 24 hr tablet Take 1 tablet (750 mg total) by mouth 2 (two) times daily. 120 tablet 0  . propranolol (INDERAL) 10 MG tablet Take 1 tablet (10 mg total) by mouth daily as needed. One hour prior to appointment 30 tablet 5   No current facility-administered medications for this visit.     Family History  Problem Relation Age of Onset  . Hypertension Mother   . Celiac disease Father   . Diabetes Paternal Uncle   . Drug abuse Paternal Uncle        heroin  . Cancer Maternal Grandmother        gland behind the ear    ROS:  Pertinent items are noted in HPI.  Otherwise, a comprehensive ROS was negative.  Exam:   BP 110/76   Pulse 64   Temp 97.7 F (36.5 C) (Oral)   Resp 16   Ht 5\' 7"  (1.702 m)   Wt 177 lb (80.3 kg)   LMP  (LMP Unknown)   BMI 27.72 kg/m  Height: 5\' 7"  (170.2 cm) Ht Readings from Last 3 Encounters:  03/30/18 5'  7" (1.702 m)  03/18/18 5\' 7"  (1.702 m)  03/17/18 5\' 7"  (1.702 m)    General appearance: alert, cooperative and appears stated age Head: Normocephalic, without obvious abnormality, atraumatic Neck: no adenopathy, supple, symmetrical, trachea midline and thyroid normal to inspection and palpation Lungs: clear to auscultation bilaterally Breasts: normal appearance, no masses or tenderness, No nipple retraction or dimpling Heart: regular rate and rhythm Abdomen: soft, non-tender; no masses,  no organomegaly Extremities: extremities normal, atraumatic, no cyanosis or edema Skin: Skin color, texture, turgor normal. No rashes or lesions Lymph nodes: Cervical, supraclavicular, and axillary nodes normal. No abnormal inguinal nodes palpated Neurologic: Grossly  normal   Pelvic: External genitalia:  no lesions, normal female              Urethra:  normal appearing urethra with no masses, tenderness or lesions              Bartholin's and Skene's: normal                 Vagina: normal appearing vagina with normal color and discharge, no lesions              Cervix: no lesions, nulliparous appearance and retroverted, IUD string noted in cervix              Pap taken: Yes.   Bimanual Exam:  Uterus:  normal size, contour, position, consistency, mobility, non-tender and anteverted              Adnexa: normal adnexa and no mass, fullness, tenderness               Rectovaginal: Confirms               Anus:  Normal appearance, refuses exam  Chaperone present: yes  A:  Well Woman with normal exam  Dysmenorrhea history, Kyleena use for cycle control  PCOS on metformin with PCP management  Anxiety/depression Prozac working well    P:   Reviewed health and wellness pertinent to exam  Warning signs with IUD reviewed and bleeding expectations. Due for removal  04/14/2022  Continue follow up with MD as indicated  Risks/benefits/warning signs with use discussed. Desires continuance.  Rx Prozac see order with instructions  Pap smear: yes   counseled on breast self exam, STD prevention, HIV risk factors and prevention, feminine hygiene, adequate intake of calcium and vitamin D, diet and exercise  return annually or prn  An After Visit Summary was printed and given to the patient.

## 2018-03-30 NOTE — Patient Instructions (Signed)

## 2018-04-01 LAB — CYTOLOGY - PAP
Diagnosis: NEGATIVE
HPV (WINDOPATH): NOT DETECTED

## 2018-04-01 NOTE — Telephone Encounter (Signed)
Medication approved by insurance. Adderall 20mg   Effective 03/19/2018 to 03/18/2021

## 2018-04-12 ENCOUNTER — Telehealth: Payer: Self-pay

## 2018-04-12 NOTE — Telephone Encounter (Signed)
Left voicemail message on patient's voicemail requesting a call back in regard to her upcoming appt on 4/3.

## 2018-04-12 NOTE — Telephone Encounter (Signed)
See note

## 2018-04-12 NOTE — Telephone Encounter (Signed)
Pt returning Jennifer's call.  °

## 2018-04-16 ENCOUNTER — Ambulatory Visit: Payer: BC Managed Care – PPO | Admitting: Family Medicine

## 2018-04-19 NOTE — Progress Notes (Signed)
Virtual Visit via Video   I connected with Misty Sanchez on 04/20/18 at 11:20 AM EDT by a video enabled telemedicine application and verified that I am speaking with the correct person using two identifiers. Location patient: Home Location provider: [facility], Office Persons participating in the virtual visit: Misty Sanchez, Misty RimaErica Brondon Wann, DO   I discussed the limitations of evaluation and management by telemedicine and the availability of in person appointments. The patient expressed understanding and agreed to proceed.  Subjective:   HPI: Patient has started adderall xr. She has had headaches and increased hunger. She has noticed increased in concentration.   Insomnia: she feels that due to stress she is having a hard time sleeping. She is only getting 4-5 hours at night. She has had a lot of  fatigue. She will be started on trazodone as well.     Since the last visit has the patient had any:  Appetite changes? No Unintentional weight loss? No Is medication working well ? Yes Does patient take drug holidays? No Difficulties falling to sleep or maintaining sleep? No Any anxiety?  No Any cardiac issues (fainting or paliptations)? No Suicidal thoughts? No Changes in health since last visit? No New medications? No Any illicit substance abuse? No Has the patient taken his medication today? Yes   She will work on planing out meals and make sure that she is getting exercise daily. She also will work on healthy snack if she does snack at night.   Reviewed all precautions and expectations with prevention of Covid-19  ROS: See pertinent positives and negatives per HPI.  Patient Active Problem List   Diagnosis Date Noted  . Primary insomnia 04/20/2018  . GAD (generalized anxiety disorder) 10/29/2017  . Dyslipidemia 09/21/2017  . ADHD (attention deficit hyperactivity disorder), combined type 07/29/2006    Social History   Tobacco Use  . Smoking status: Never Smoker  .  Smokeless tobacco: Never Used  Substance Use Topics  . Alcohol use: No   Current Outpatient Medications:  .  amphetamine-dextroamphetamine (ADDERALL XR) 20 MG 24 hr capsule, Take 1 capsule (20 mg total) by mouth every morning., Disp: 30 capsule, Rfl: 0 .  Cholecalciferol (VITAMIN D PO), Take by mouth daily., Disp: , Rfl:  .  FLUoxetine (PROZAC) 10 MG tablet, Take 1 tablet (10 mg total) by mouth daily., Disp: 90 tablet, Rfl: 3 .  IRON PO, Take by mouth daily., Disp: , Rfl:  .  Levonorgestrel (MIRENA, 52 MG, IU), by Intrauterine route., Disp: , Rfl:  .  metFORMIN (GLUCOPHAGE XR) 750 MG 24 hr tablet, Take 1 tablet (750 mg total) by mouth 2 (two) times daily., Disp: 120 tablet, Rfl: 0 .  propranolol (INDERAL) 10 MG tablet, Take 1 tablet (10 mg total) by mouth daily as needed. One hour prior to appointment, Disp: 30 tablet, Rfl: 5  Allergies  Allergen Reactions  . Azithromycin     Nausea ( z-pack )    Objective:   VITALS: Per patient if applicable, see vitals. GENERAL: Alert, appears well and in no acute distress. HEENT: Atraumatic, conjunctiva clear, no obvious abnormalities on inspection of external nose and ears. NECK: Normal movements of the head and neck. CARDIOPULMONARY: No increased WOB. Speaking in clear sentences. I:E ratio WNL.  MS: Moves all visible extremities without noticeable abnormality. PSYCH: Pleasant and cooperative, well-groomed. Speech normal rate and rhythm. Affect is appropriate. Insight and judgement are appropriate. Attention is focused, linear, and appropriate.  NEURO: CN grossly intact.  Oriented as arrived to appointment on time with no prompting. Moves both UE equally.  SKIN: No obvious lesions, wounds, erythema, or cyanosis noted on face or hands.  Assessment and Plan:   Misty Sanchez was seen today for transitions of care.  Diagnoses and all orders for this visit:  ADHD (attention deficit hyperactivity disorder), combined type -     lisdexamfetamine (VYVANSE)  20 MG capsule; Take 1 capsule (20 mg total) by mouth daily.  Primary insomnia -     traZODone (DESYREL) 50 MG tablet; 1/2 to 2 tabs at night.   . Reviewed expectations re: course of current medical issues. . Discussed self-management of symptoms. . Outlined signs and symptoms indicating need for more acute intervention. . Patient verbalized understanding and all questions were answered. Marland Kitchen Health Maintenance issues including appropriate healthy diet, exercise, and smoking avoidance were discussed with patient. . See orders for this visit as documented in the electronic medical record.  Misty Rima, DO 04/20/2018

## 2018-04-20 ENCOUNTER — Telehealth: Payer: Self-pay | Admitting: Family Medicine

## 2018-04-20 ENCOUNTER — Encounter: Payer: Self-pay | Admitting: Family Medicine

## 2018-04-20 ENCOUNTER — Other Ambulatory Visit: Payer: Self-pay

## 2018-04-20 ENCOUNTER — Ambulatory Visit (INDEPENDENT_AMBULATORY_CARE_PROVIDER_SITE_OTHER): Payer: BC Managed Care – PPO | Admitting: Family Medicine

## 2018-04-20 VITALS — BP 125/86 | HR 85 | Temp 97.5°F | Ht 67.0 in | Wt 175.8 lb

## 2018-04-20 DIAGNOSIS — F5101 Primary insomnia: Secondary | ICD-10-CM | POA: Insufficient documentation

## 2018-04-20 DIAGNOSIS — F902 Attention-deficit hyperactivity disorder, combined type: Secondary | ICD-10-CM

## 2018-04-20 MED ORDER — TRAZODONE HCL 50 MG PO TABS: ORAL_TABLET | ORAL | 3 refills | Status: DC

## 2018-04-20 MED ORDER — LISDEXAMFETAMINE DIMESYLATE 20 MG PO CAPS: 20.0000 mg | ORAL_CAPSULE | Freq: Every day | ORAL | 0 refills | Status: DC

## 2018-04-20 NOTE — Telephone Encounter (Signed)
Copied from CRM [id number]. Topic: Quick Communication - See Telephone Encounter >> Apr 20, 2018 12:57 PM Terisa Starr wrote: CRM for notification. See Telephone encounter for: 04/20/18.  Patient said that she had a virtual visit with Dr Earlene Plater earlier and she advised her she would send in Vyvanse and Trazodone. She told her to give the pharmacy about one hour and call to see if they needed a PA. The pharmacy never received the scripts.

## 2018-04-20 NOTE — Telephone Encounter (Signed)
See note

## 2018-04-20 NOTE — Telephone Encounter (Signed)
Rxs signed today at 1:09 PM. Will confirm receipt with pharmacy.

## 2018-04-20 NOTE — Telephone Encounter (Addendum)
Called pharmacy and confirmed that rxs were received.   Vyvanse is requiring PA BIN [id number] PCN  RxGrp [id number] ID [id number]   Called pt and advised.

## 2018-04-20 NOTE — Telephone Encounter (Signed)
PA initiated via [email or web address]  Hillery Davisson (Key: [id number])   [employer] is processing your PA request and will respond shortly with next steps. You are currently using the fastest method to process this prior authorization. Please do not fax or call [employer] to resubmit this request. To check for an update later, open this request again from your dashboard.

## 2018-04-20 NOTE — Telephone Encounter (Signed)
Misty Sanchez (Key: [id number])   This request has received a Favorable outcome. Please note any additional information provided by [employer] at the bottom of this request.

## 2018-04-21 NOTE — Telephone Encounter (Signed)
Called pt and advised of PA approval

## 2018-04-29 ENCOUNTER — Ambulatory Visit: Payer: BC Managed Care – PPO | Admitting: Sports Medicine

## 2018-05-11 ENCOUNTER — Other Ambulatory Visit: Payer: Self-pay

## 2018-05-11 ENCOUNTER — Encounter: Payer: Self-pay | Admitting: Family Medicine

## 2018-05-11 ENCOUNTER — Ambulatory Visit (INDEPENDENT_AMBULATORY_CARE_PROVIDER_SITE_OTHER): Payer: BC Managed Care – PPO | Admitting: Family Medicine

## 2018-05-11 VITALS — BP 104/74 | HR 80 | Temp 98.0°F | Ht 67.0 in | Wt 177.0 lb

## 2018-05-11 DIAGNOSIS — F902 Attention-deficit hyperactivity disorder, combined type: Secondary | ICD-10-CM

## 2018-05-11 MED ORDER — LISDEXAMFETAMINE DIMESYLATE 40 MG PO CAPS: 40.0000 mg | ORAL_CAPSULE | ORAL | 0 refills | Status: DC

## 2018-05-11 NOTE — Progress Notes (Signed)
Virtual Visit via Video   Due to the COVID-19 pandemic, this visit was completed with telemedicine (audio/video) technology to reduce patient and provider exposure as well as to preserve personal protective equipment.   I connected with Misty Sanchez on 05/11/18 at 10:20 AM EDT by a video enabled telemedicine application and verified that I am speaking with the correct person using two identifiers. Location patient: Home Location provider: [facility], Office Persons participating in the virtual visit: Misty Sanchez, Misty Rima, DO   I discussed the limitations of evaluation and management by telemedicine and the availability of in person appointments. The patient expressed understanding and agreed to proceed.  Care Team   Patient Care Team: Misty Rima, DO as PCP - General (Family Medicine)  Subjective:   HPI: Since the last visit has the patient had any:  Appetite changes? No Unintentional weight loss? No Is medication working well ? No Does patient take drug holidays? No Difficulties falling to sleep or maintaining sleep? No Any anxiety?  No Any cardiac issues (fainting or paliptations)? No Suicidal thoughts? No Changes in health since last visit? No New medications? No Any illicit substance abuse? No Has the patient taken his medication today? Yes  Review of Systems  Constitutional: Negative for chills, fever, malaise/fatigue and weight loss.  Respiratory: Negative for cough, shortness of breath and wheezing.   Cardiovascular: Negative for chest pain, palpitations and leg swelling.  Gastrointestinal: Negative for abdominal pain, constipation, diarrhea, nausea and vomiting.  Genitourinary: Negative for dysuria and urgency.  Musculoskeletal: Negative for joint pain and myalgias.  Skin: Negative for rash.  Neurological: Negative for dizziness and headaches.  Psychiatric/Behavioral: Negative for depression, substance abuse and suicidal ideas. The patient is  not nervous/anxious.      Patient Active Problem List   Diagnosis Date Noted  . Primary insomnia 04/20/2018  . GAD (generalized anxiety disorder) 10/29/2017  . Dyslipidemia 09/21/2017  . ADHD (attention deficit hyperactivity disorder), combined type 07/29/2006    Social History   Tobacco Use  . Smoking status: Never Smoker  . Smokeless tobacco: Never Used  Substance Use Topics  . Alcohol use: No    Current Outpatient Medications:  .  Cholecalciferol (VITAMIN D PO), Take by mouth daily., Disp: , Rfl:  .  FLUoxetine (PROZAC) 10 MG tablet, Take 1 tablet (10 mg total) by mouth daily., Disp: 90 tablet, Rfl: 3 .  IRON PO, Take by mouth daily., Disp: , Rfl:  .  Levonorgestrel (MIRENA, 52 MG, IU), by Intrauterine route., Disp: , Rfl:  .  metFORMIN (GLUCOPHAGE XR) 750 MG 24 hr tablet, Take 1 tablet (750 mg total) by mouth 2 (two) times daily., Disp: 120 tablet, Rfl: 0 .  propranolol (INDERAL) 10 MG tablet, Take 1 tablet (10 mg total) by mouth daily as needed. One hour prior to appointment, Disp: 30 tablet, Rfl: 5 .  traZODone (DESYREL) 50 MG tablet, 1/2 to 2 tabs at night., Disp: 30 tablet, Rfl: 3 .  lisdexamfetamine (VYVANSE) 40 MG capsule, Take 1 capsule (40 mg total) by mouth every morning., Disp: 30 capsule, Rfl: 0  Allergies  Allergen Reactions  . Azithromycin     Nausea ( z-pack )    Objective:   VITALS: Per patient if applicable, see vitals. GENERAL: Alert, appears well and in no acute distress. HEENT: Atraumatic, conjunctiva clear, no obvious abnormalities on inspection of external nose and ears. NECK: Normal movements of the head and neck. CARDIOPULMONARY: No increased WOB. Speaking in clear sentences.  I:E ratio WNL.  MS: Moves all visible extremities without noticeable abnormality. PSYCH: Pleasant and cooperative, well-groomed. Speech normal rate and rhythm. Affect is appropriate. Insight and judgement are appropriate. Attention is focused, linear, and appropriate.    NEURO: CN grossly intact. Oriented as arrived to appointment on time with no prompting. Moves both UE equally.  SKIN: No obvious lesions, wounds, erythema, or cyanosis noted on face or hands.  [facility] 03/17/2018 10/29/2017 09/29/2017  Decreased Interest 0 1 0  Down, Depressed, Hopeless 0 1 0  PHQ - 2 Score 0 2 0  Altered sleeping 2 1 -  Tired, decreased energy 2 3 -  Change in appetite 1 0 -  Feeling bad or failure about yourself  0 1 -  Trouble concentrating 1 1 -  Moving slowly or fidgety/restless 0 0 -  Suicidal thoughts 0 0 -  PHQ-9 Score 6 8 -  Difficult doing work/chores Somewhat difficult Somewhat difficult -   Assessment and Plan:   Lurena JoinerRebecca was seen today for medication refill.  Diagnoses and all orders for this visit:  ADHD (attention deficit hyperactivity disorder), combined type -     lisdexamfetamine (VYVANSE) 40 MG capsule; Take 1 capsule (40 mg total) by mouth every morning.    Marland Kitchen. COVID-19 Education:The signs and symptoms of COVID-19 were discussed with the patient and how to seek care for testing if needed. The importance of social distancing was discussed today. . Reviewed expectations re: course of current medical issues. . Discussed self-management of symptoms. . Outlined signs and symptoms indicating need for more acute intervention. . Patient verbalized understanding and all questions were answered. Marland Kitchen. Health Maintenance issues including appropriate healthy diet, exercise, and smoking avoidance were discussed with patient. . See orders for this visit as documented in the electronic medical record.  Misty RimaErica Latesha Chesney, DO 05/11/2018  Records requested if needed. Time spent: 20 minutes, of which >50% was spent in obtaining information about her symptoms, reviewing her previous labs, evaluations, and treatments, counseling her about her condition (please see the discussed topics above), and developing a plan to further investigate it; she had a number  of questions which I addressed.

## 2018-05-17 ENCOUNTER — Telehealth: Payer: Self-pay

## 2018-05-17 NOTE — Telephone Encounter (Addendum)
Vyvanse 40 mg cap requires PA  [id number]  Cover[employer]ymeds Key [id number]

## 2018-05-18 NOTE — Telephone Encounter (Signed)
Misty Sanchez (Key: [id number])   Your demographic data has been sent to the plan successfully. They will respond with your clinical questions and you will be notified by email when available within the next business day. You can also check for an update later by opening this request from your dashboard. Please do not fax or call the plan to resubmit this request. If you need assistance, please chat with CoverMyMeds or call us at [phone].

## 2018-05-18 NOTE — Telephone Encounter (Signed)
PA initiated via [email or web address]  

## 2018-05-19 NOTE — Telephone Encounter (Signed)
Sharisse Zelaya (Key: [id number])   This request has received a Favorable outcome. Please note any additional information provided by [employer] at the bottom of this request.

## 2018-05-20 NOTE — Telephone Encounter (Signed)
See note

## 2018-05-20 NOTE — Telephone Encounter (Signed)
I called the patient. She will call pharmacy to see about PA.

## 2018-05-20 NOTE — Telephone Encounter (Signed)
Pt called about this medication needing a  PA. Please advise.

## 2018-05-20 NOTE — Telephone Encounter (Signed)
Please let patient know to contact her pharmacy to see if they have received prior auth.

## 2018-06-09 ENCOUNTER — Telehealth: Payer: Self-pay | Admitting: Family Medicine

## 2018-06-09 ENCOUNTER — Other Ambulatory Visit: Payer: Self-pay

## 2018-06-09 ENCOUNTER — Encounter: Payer: Self-pay | Admitting: Family Medicine

## 2018-06-09 ENCOUNTER — Ambulatory Visit (INDEPENDENT_AMBULATORY_CARE_PROVIDER_SITE_OTHER): Payer: BC Managed Care – PPO | Admitting: Family Medicine

## 2018-06-09 VITALS — HR 80 | Temp 97.7°F | Ht 67.0 in | Wt 173.2 lb

## 2018-06-09 DIAGNOSIS — F902 Attention-deficit hyperactivity disorder, combined type: Secondary | ICD-10-CM

## 2018-06-09 DIAGNOSIS — F5101 Primary insomnia: Secondary | ICD-10-CM | POA: Diagnosis not present

## 2018-06-09 DIAGNOSIS — E663 Overweight: Secondary | ICD-10-CM

## 2018-06-09 DIAGNOSIS — E785 Hyperlipidemia, unspecified: Secondary | ICD-10-CM | POA: Diagnosis not present

## 2018-06-09 DIAGNOSIS — E8881 Metabolic syndrome: Secondary | ICD-10-CM

## 2018-06-09 DIAGNOSIS — E88819 Insulin resistance, unspecified: Secondary | ICD-10-CM

## 2018-06-09 NOTE — Addendum Note (Signed)
Addended by: Helane Rima R on: 06/09/2018 02:46 PM   Modules accepted: Orders

## 2018-06-09 NOTE — Addendum Note (Signed)
Addended by: Dierdre Searles on: 06/09/2018 02:45 PM   Modules accepted: Orders

## 2018-06-09 NOTE — Telephone Encounter (Signed)
See note  Copied from CRM [id number]. Topic: General - Other >> Jun 09, 2018 12:12 PM Jaquita Rector A wrote: Reason for CRM: Patient called back to say that she is willing to try medication discussed with Dr Earlene Plater on the phone call. She states taht she have a few more questions to ask before committing to anything. Please call Ph# [phone]

## 2018-06-09 NOTE — Progress Notes (Signed)
Virtual Visit via Video   Due to the COVID-19 pandemic, this visit was completed with telemedicine (audio/video) technology to reduce patient and provider exposure as well as to preserve personal protective equipment.   I connected with Misty Sanchez by a video enabled telemedicine application and verified that I am speaking with the correct person using two identifiers. Location patient: Home Location provider: [facility], Office Persons participating in the virtual visit: Misty Sanchez, Helane RimaErica Shauntia Levengood, DO Barnie MortJoEllen Thompson, CMA acting as scribe for Dr. Helane RimaErica Harshan Kearley.   I discussed the limitations of evaluation and management by telemedicine and the availability of in person appointments. The patient expressed understanding and agreed to proceed.  Care Team   Patient Care Team: Helane RimaWallace, Sidharth Leverette, DO as PCP - General (Family Medicine)  Subjective:   HPI: Misty Sanchez was increased at last visit. She has not noticed any improvement of symptoms. She has been taking Misty Sanchez 40mg  every morning. She is working from home but is trying to work out daily for at least 45 minutes daily. She is still having virtual visits with trainer daily. He keeps eye on food log.   Review of Systems  Constitutional: Negative for chills and fever.  HENT: Negative for hearing loss and tinnitus.   Eyes: Negative for blurred vision and double vision.  Respiratory: Negative for cough.   Cardiovascular: Negative for chest pain and palpitations.  Gastrointestinal: Negative for nausea and vomiting.  Genitourinary: Negative for dysuria and urgency.  Musculoskeletal: Negative for myalgias.  Skin: Negative for rash.  Neurological: Negative for dizziness and headaches.  Endo/Heme/Allergies: Does not bruise/bleed easily.  Psychiatric/Behavioral: Negative for suicidal ideas.     Patient Active Problem List   Diagnosis Date Noted  . Insulin resistance 06/11/2018  . Overweight with body mass index (BMI) 25.0-29.9  06/11/2018  . Primary insomnia 04/20/2018  . GAD (generalized anxiety disorder) 10/29/2017  . Dyslipidemia 09/21/2017  . ADHD (attention deficit hyperactivity disorder), combined type 07/29/2006    Social History   Tobacco Use  . Smoking status: Never Smoker  . Smokeless tobacco: Never Used  Substance Use Topics  . Alcohol use: No    Current Outpatient Medications:  .  Cholecalciferol (VITAMIN D PO), Take by mouth daily., Disp: , Rfl:  .  FLUoxetine (PROZAC) 10 MG tablet, Take 1 tablet (10 mg total) by mouth daily., Disp: 90 tablet, Rfl: 3 .  IRON PO, Take by mouth daily., Disp: , Rfl:  .  Levonorgestrel (MIRENA, 52 MG, IU), by Intrauterine route., Disp: , Rfl:  .  lisdexamfetamine (Misty Sanchez) 40 MG capsule, Take 1 capsule (40 mg total) by mouth every morning., Disp: 30 capsule, Rfl: 0 .  metFORMIN (GLUCOPHAGE XR) 750 MG 24 hr tablet, Take 1 tablet (750 mg total) by mouth 2 (two) times daily., Disp: 120 tablet, Rfl: 0 .  propranolol (INDERAL) 10 MG tablet, Take 1 tablet (10 mg total) by mouth daily as needed. One hour prior to appointment, Disp: 30 tablet, Rfl: 5 .  traZODone (DESYREL) 50 MG tablet, 1/2 to 2 tabs at night., Disp: 30 tablet, Rfl: 3  Allergies  Allergen Reactions  . Azithromycin     Nausea ( z-pack )    Objective:   VITALS: Per patient if applicable, see vitals. GENERAL: Alert, appears well and in no acute distress. HEENT: Atraumatic, conjunctiva clear, no obvious abnormalities on inspection of external nose and ears. NECK: Normal movements of the head and neck. CARDIOPULMONARY: No increased WOB. Speaking in clear sentences. I:E ratio  WNL.  MS: Moves all visible extremities without noticeable abnormality. PSYCH: Pleasant and cooperative, well-groomed. Speech normal rate and rhythm. Affect is appropriate. Insight and judgement are appropriate. Attention is focused, linear, and appropriate.  NEURO: CN grossly intact. Oriented as arrived to appointment on time with  no prompting. Moves both UE equally.  SKIN: No obvious lesions, wounds, erythema, or cyanosis noted on face or hands.  Depression screen [facility] 2/9 06/09/2018 03/17/2018 10/29/2017  Decreased Interest 0 0 1  Down, Depressed, Hopeless 0 0 1  PHQ - 2 Score 0 0 2  Altered sleeping 0 2 1  Tired, decreased energy 0 2 3  Change in appetite 0 1 0  Feeling bad or failure about yourself  0 0 1  Trouble concentrating 0 1 1  Moving slowly or fidgety/restless 0 0 0  Suicidal thoughts 0 0 0  PHQ-9 Score 0 6 8  Difficult doing work/chores Not difficult at all Somewhat difficult Somewhat difficult    Assessment and Plan:   Misty Sanchez was seen today for follow-up.  Diagnoses and all orders for this visit:  ADHD (attention deficit hyperactivity disorder), combined type  Primary insomnia  Dyslipidemia  Insulin resistance  Overweight with body mass index (BMI) 25.0-29.9  Diet and exercise regimen reviewed. Discussed eating window, decreasing CHO at dinner. Discussed BMI 27 is healthy. She struggles to maintain. She is "always hungry." I have asked her to look at [facility] and let me know if she is interested in trying the medication after reading about it.   Marland Kitchen COVID-19 Education: The signs and symptoms of COVID-19 were discussed with the patient and how to seek care for testing if needed. The importance of social distancing was discussed today. . Reviewed expectations re: course of current medical issues. . Discussed self-management of symptoms. . Outlined signs and symptoms indicating need for more acute intervention. . Patient verbalized understanding and all questions were answered. Marland Kitchen Health Maintenance issues including appropriate healthy diet, exercise, and smoking avoidance were discussed with patient. . See orders for this visit as documented in the electronic medical record.  Helane Rima, DO  Records requested if needed. Time spent: 25 minutes, of which >50% was spent in obtaining  information about her symptoms, reviewing her previous labs, evaluations, and treatments, counseling her about her condition (please see the discussed topics above), and developing a plan to further investigate it; she had a number of questions which I addressed.

## 2018-06-09 NOTE — Telephone Encounter (Signed)
Called and spoke with patient and answered questions. 1) How do the needles work? I advised pt that we can send in rx for pen needle and it should be provided at the time that she picks up her prescription. You should also be able to purchase a sharps container from her local pharmacy.  2) Where does it go? Advised pt that we could set up nurse visit to do a teaching with her to make sure she will be giving the injection in the proper location and to make sure she is comfortable doing the injection herself at home.  3) Cost? PA is required for this medication with her insurance. This can be started as soon as the prescription is sent in. She is also aware of savings card that can be printed and taken to the pharmacy for additional savings, she spoke with her insurance provider about this.

## 2018-06-10 MED ORDER — PEN NEEDLES 32G X 4 MM MISC: 1.0000 | Freq: Every day | 0 refills | Status: DC

## 2018-06-10 MED ORDER — LIRAGLUTIDE -WEIGHT MANAGEMENT 18 MG/3ML ~~LOC~~ SOPN: 3.0000 mg | PEN_INJECTOR | Freq: Every day | SUBCUTANEOUS | 0 refills | Status: DC

## 2018-06-10 NOTE — Telephone Encounter (Signed)
Spoke with pt and scheduled NUR visit for inj teaching. She will bring Saxenda and Pen Needle with her.

## 2018-06-10 NOTE — Telephone Encounter (Signed)
Sent to pharmacy. Offer pamphlet for [facility] and teaching.

## 2018-06-10 NOTE — Telephone Encounter (Signed)
Misty Sanchez (Key: [id number])   Your information has been submitted to [employer]. To check for an updated outcome later, reopen this PA request from your dashboard. If [employer] has not responded to your request within 24 hours, contact [employer] at [phone]. If you think there may be a problem with your PA request, use our live chat feature at the bottom right.

## 2018-06-10 NOTE — Addendum Note (Signed)
Addended by: Helane Rima R on: 06/10/2018 07:22 AM   Modules accepted: Orders

## 2018-06-10 NOTE — Telephone Encounter (Signed)
Misty Sanchez (Key: [id number])   This request has received a Favorable outcome. Please note any additional information provided by [employer] at the bottom of this request.

## 2018-06-10 NOTE — Telephone Encounter (Signed)
PA initiated via [email or web address]  

## 2018-06-11 ENCOUNTER — Other Ambulatory Visit: Payer: Self-pay

## 2018-06-11 ENCOUNTER — Encounter: Payer: Self-pay | Admitting: Family Medicine

## 2018-06-11 ENCOUNTER — Ambulatory Visit (INDEPENDENT_AMBULATORY_CARE_PROVIDER_SITE_OTHER): Payer: BC Managed Care – PPO | Admitting: Physician Assistant

## 2018-06-11 ENCOUNTER — Encounter: Payer: Self-pay | Admitting: Physician Assistant

## 2018-06-11 DIAGNOSIS — E663 Overweight: Secondary | ICD-10-CM | POA: Insufficient documentation

## 2018-06-11 DIAGNOSIS — E88819 Insulin resistance, unspecified: Secondary | ICD-10-CM | POA: Insufficient documentation

## 2018-06-11 DIAGNOSIS — E8881 Metabolic syndrome: Secondary | ICD-10-CM | POA: Insufficient documentation

## 2018-06-11 DIAGNOSIS — Z7189 Other specified counseling: Secondary | ICD-10-CM

## 2018-06-11 NOTE — Progress Notes (Signed)
Patient came in office to day for teaching on use of Saxenda. Patient had her own prescription that was picked up at her pharmacy. Patient was able to self administer injection with little instruction. She was given printed information for reference on how to give injection, disposal of needle and dose titration. She will call if any questions.   Agree with the above assessment and plan.  Jarold Motto PA-C

## 2018-06-23 ENCOUNTER — Telehealth: Payer: Self-pay | Admitting: Family Medicine

## 2018-06-23 ENCOUNTER — Other Ambulatory Visit: Payer: Self-pay

## 2018-06-23 DIAGNOSIS — R635 Abnormal weight gain: Secondary | ICD-10-CM

## 2018-06-23 MED ORDER — METFORMIN HCL ER 750 MG PO TB24: 750.0000 mg | ORAL_TABLET | Freq: Two times a day (BID) | ORAL | 0 refills | Status: DC

## 2018-06-23 NOTE — Telephone Encounter (Unsigned)
Copied from [place] [id number]. Topic: Quick Communication - Rx Refill/Question >> Jun 23, 2018 11:30 AM Celene Kras A wrote: Medication: metFORMIN (GLUCOPHAGE XR) 750 MG 24 hr tablet  Has the patient contacted their pharmacy? No. (Agent: If no, request that the patient contact the pharmacy for the refill.) (Agent: If yes, when and what did the pharmacy advise?)  Preferred Pharmacy (with phone number or street name): [place], [place] - [address] [place] [address] Phone: [phone] Fax: [phone] Not a 24 hour pharmacy; exact hours not known.    Agent: Please be advised that RX refills may take up to 3 business days. We ask that you follow-up with your pharmacy.

## 2018-06-23 NOTE — Telephone Encounter (Signed)
Refill for Metformin sent in to [facility] on [address] 

## 2018-06-23 NOTE — Progress Notes (Signed)
Refill for Metformin sent in to [facility] on [address]

## 2018-06-23 NOTE — Telephone Encounter (Signed)
See note

## 2018-07-15 ENCOUNTER — Other Ambulatory Visit: Payer: Self-pay

## 2018-07-15 ENCOUNTER — Ambulatory Visit: Payer: Self-pay

## 2018-07-15 MED ORDER — METFORMIN HCL 850 MG PO TABS: 850.0000 mg | ORAL_TABLET | Freq: Two times a day (BID) | ORAL | 1 refills | Status: DC

## 2018-07-15 NOTE — Telephone Encounter (Signed)
New Rx for Metformin sent in per protocol.  Patient aware.

## 2018-07-15 NOTE — Telephone Encounter (Signed)
Patient called and says that she received a letter from her pharmacy that says the FDA did testing on 7 lots of ER Metformin and there is a voluntary recall. However, the letter says the company decided to extend the recall to all lots of ER Metformin and was told to contact the provider. She's asking what will she need to do? I advised to continue taking the Metformin as prescribed until she hears from the office with Dr. Alcario Drought recommendation, she verbalized understanding. I advised I will send this note to Dr. Juleen China for review.  Reason for Disposition . Caller has NON-URGENT medication question about med that PCP prescribed and triager unable to answer question  Protocols used: MEDICATION QUESTION CALL-A-AH

## 2018-07-26 ENCOUNTER — Other Ambulatory Visit: Payer: Self-pay | Admitting: Family Medicine

## 2018-07-26 NOTE — Telephone Encounter (Signed)
Pt stated her medication for metFORMIN (GLUCOPHAGE) 850 MG tablet was never received at the pharmacy    Saxenda refill also   [facility].  Please advise

## 2018-07-26 NOTE — Telephone Encounter (Signed)
See note

## 2018-07-27 MED ORDER — METFORMIN HCL 850 MG PO TABS: 850.0000 mg | ORAL_TABLET | Freq: Two times a day (BID) | ORAL | 1 refills | Status: DC

## 2018-07-27 NOTE — Telephone Encounter (Signed)
Okay to fill? 

## 2018-07-27 NOTE — Telephone Encounter (Signed)
Rx request Last fill Metformin 08/02/18  #180/1 Last fill gor Saxenda 07/26/18  82ml/0 Last OV 06/11/18 w/Sam Okay to refill?

## 2018-07-27 NOTE — Telephone Encounter (Signed)
Metformin sent to pharmacy and Kirke Shaggy was sent yeterday 7/13 to pharmacy. Pt notified

## 2018-09-02 ENCOUNTER — Other Ambulatory Visit: Payer: Self-pay | Admitting: Family Medicine

## 2018-09-03 NOTE — Telephone Encounter (Signed)
Last fill 06/10/18 Last OV 06/11/18

## 2018-09-18 ENCOUNTER — Other Ambulatory Visit: Payer: Self-pay | Admitting: Family Medicine

## 2018-10-15 NOTE — Telephone Encounter (Signed)
Monte Stormes (Key: [id number]) Saxenda 18MG /3ML pen-injectors   Form Charity fundraiser PA Form (NCPDP) Created 4 days ago Sent to Plan 2 days ago Plan Response 2 days ago Submit Clinical Questions 2 days ago Determination Favorable 2 days ago Your prior authorization for Kirke Shaggy has been approved! MORE INFO For eligible patients, copay assistance may be available. To learn more and be redirected to the [facility] website, click on the "More Info" button to the right. Please also note that you may need to schedule a follow-up visit with your patient prior to the expiration of this prior authorization, as updated patient weight may be required for reauthorization.  Message from plan: Your PA request has been approved. Additional information will be provided in the approval communication. (Message 1145)

## 2018-12-10 ENCOUNTER — Other Ambulatory Visit: Payer: Self-pay | Admitting: Family Medicine

## 2018-12-15 NOTE — Telephone Encounter (Signed)
Last OV 06/11/18 with Inda Coke Next OV 12/16/18 with Dr. Rogers Blocker

## 2018-12-16 ENCOUNTER — Ambulatory Visit (INDEPENDENT_AMBULATORY_CARE_PROVIDER_SITE_OTHER): Payer: BC Managed Care – PPO | Admitting: Family Medicine

## 2018-12-16 ENCOUNTER — Encounter: Payer: Self-pay | Admitting: Family Medicine

## 2018-12-16 VITALS — HR 83 | Ht 67.0 in | Wt 177.0 lb

## 2018-12-16 DIAGNOSIS — R000 Tachycardia, unspecified: Secondary | ICD-10-CM | POA: Diagnosis not present

## 2018-12-16 DIAGNOSIS — F411 Generalized anxiety disorder: Secondary | ICD-10-CM

## 2018-12-16 DIAGNOSIS — E663 Overweight: Secondary | ICD-10-CM | POA: Diagnosis not present

## 2018-12-16 MED ORDER — PROPRANOLOL HCL 10 MG PO TABS: 10.0000 mg | ORAL_TABLET | Freq: Every day | ORAL | 5 refills | Status: DC | PRN

## 2018-12-16 NOTE — Progress Notes (Signed)
Patient: Misty Sanchez MRN: [id number] DOB: Oct 10, 1980 PCP: Misty Deutscher, DO     I connected with Misty Sanchez on 12/17/18 at 10:28am by a video enabled telemedicine application and verified that I am speaking with the correct person using two identifiers.  Location patient: Home Location provider: [facility], Office Persons participating in this virtual visit: [name] Gjerde and Dr. Rogers Blocker   I discussed the limitations of evaluation and management by telemedicine and the availability of in person appointments. The patient expressed understanding and agreed to proceed.   Subjective:  Chief Complaint  Patient presents with  . Weight Gain  . Headache  . Nausea    HPI: The patient is a 38 y.o. female who presents today for weight gain, nausea and increased anxiety. She has recently gained more weight and it's causing her a lot of distress. Dr. Juleen Sanchez put her on Saxenda back in May and the side effects are terrible and makes her feel terrible. She tried to get back in to [facility] they would not see her since her BMI is not over 30. She also feels like her anxiety is so much worse and she is not happy due to the weight gain. She got down to 170 pounds and is now up to 177 pounds. She also finds herself emotionally eating at times. she does not want to stop medication as she feels like it is the only medication that she has. Her goal weight is 145.   She does not want to do change her anxiety medication at all until we hear back form dr. Juleen Sanchez.   Also need refill of her propranolol for anxiety that she takes prior to plasma donation.   Review of Systems  Constitutional: Positive for fatigue and unexpected weight change.  Eyes: Negative for visual disturbance.  Respiratory: Negative for shortness of breath.   Cardiovascular: Negative for chest pain, palpitations and leg swelling.  Gastrointestinal: Negative for abdominal pain,  diarrhea and nausea.       Pt c/o feeling that food is "digesting very fast" and starts having abdominal pain  Genitourinary: Positive for frequency. Negative for dysuria, flank pain and urgency.  Musculoskeletal: Negative for back pain, neck pain and neck stiffness.  Neurological: Positive for headaches. Negative for dizziness.  Psychiatric/Behavioral: Positive for sleep disturbance.    Allergies Patient is allergic to azithromycin.  Past Medical History Patient  has a past medical history of ADHD (attention deficit hyperactivity disorder), Anxiety, Frequent UTI, Hyperlipidemia, IC (interstitial cystitis), and PCOS (polycystic ovarian syndrome).  Surgical History Patient  has a past surgical history that includes Wisdom tooth extraction and Tympanostomy tube placement (Bilateral, 03/2013).  Family History Pateint's family history includes Cancer in her maternal grandmother; Celiac disease in her father; Diabetes in her paternal uncle; Drug abuse in her paternal uncle; Hypertension in her mother.  Social History Patient  reports that she has never smoked. She has never used smokeless tobacco. She reports that she does not drink alcohol or use drugs.    Objective: Vitals:   12/16/18 0959  Pulse: 83  Weight: 177 lb (80.3 kg)  Height: 5\' 7"  (1.702 m)    Body mass index is 27.72 kg/m.  Physical Exam Vitals signs reviewed.  Constitutional:      Appearance: She is well-developed.  HENT:     Head: Normocephalic and atraumatic.  Pulmonary:     Effort: Pulmonary effort is normal.  Neurological:  Mental Status: She is alert and oriented to person, place, and time.  Psychiatric:        Mood and Affect: Mood normal.        Behavior: Behavior normal.        Assessment/plan: 1. Overweight with body mass index (BMI) 25.0-29.9 Will see if dr. Earlene Sanchez can still see her at weight loss clinic since I do not do weight loss. Advised she stop the saxenda due to side effects, but she  does not want to do this. Will wait to see if we can get her in with dr. Earlene Sanchez.   2. Elevated pulse rate Take for anxiety prn. Refills given.  - propranolol (INDERAL) 10 MG tablet; Take 1 tablet (10 mg total) by mouth daily as needed. One hour prior to appointment  Dispense: 30 tablet; Refill: 5  3. GAD (generalized anxiety disorder) I think we need to increase her prozac and also wonder if she has some body dysmorphia going on; however, she does not want to change anything and would rather work on weight loss since this is the root of the issue. I think counseling would be a good option as well, but she is persistent to see weight loss physician.   Will email DR. Earlene Sanchez about getting her into weight clinic and get back to patient.     Return if symptoms worsen or fail to improve.  Records requested if needed. Time spent with patient: 15 minutes, of which >50% was spent in obtaining information about her symptoms, reviweing her previous labs, evaluations, and treatments, counseling her about her conditions (please see discussed topics above), and developing a plan to further investigate it; she had a number of questions which I addressed.    Orland Mustard, MD [facility]  12/17/2018

## 2018-12-20 ENCOUNTER — Encounter: Payer: Self-pay | Admitting: Family Medicine

## 2019-02-18 ENCOUNTER — Encounter: Payer: Self-pay | Admitting: Family Medicine

## 2019-02-18 ENCOUNTER — Ambulatory Visit (INDEPENDENT_AMBULATORY_CARE_PROVIDER_SITE_OTHER): Payer: BC Managed Care – PPO | Admitting: Family Medicine

## 2019-02-18 ENCOUNTER — Other Ambulatory Visit: Payer: Self-pay

## 2019-02-18 VITALS — BP 90/54 | HR 91 | Temp 97.7°F | Ht 67.0 in | Wt 184.0 lb

## 2019-02-18 DIAGNOSIS — F411 Generalized anxiety disorder: Secondary | ICD-10-CM | POA: Diagnosis not present

## 2019-02-18 DIAGNOSIS — F5101 Primary insomnia: Secondary | ICD-10-CM | POA: Diagnosis not present

## 2019-02-18 DIAGNOSIS — Z0000 Encounter for general adult medical examination without abnormal findings: Secondary | ICD-10-CM

## 2019-02-18 MED ORDER — FLUOXETINE HCL 20 MG PO CAPS: 20.0000 mg | ORAL_CAPSULE | Freq: Every day | ORAL | 1 refills | Status: DC

## 2019-02-18 NOTE — Progress Notes (Signed)
Patient: Misty Sanchez MRN: [id number] DOB: 04-Oct-1980 PCP: Orma Flaming, MD     Subjective:  Chief Complaint  Patient presents with  . Establish Care  . Weight Gain    HPI: The patient is a 39 y.o. female who presents today for establishing care with me since Dr. Juleen China left. She has a past medical history ADHD on no medication, dyslipidemia, GAD on prozac, insomnia. She also would like to lose weight. She is due for her annual.   The patient is a 39 year old female who presents today for annual exam. She denies any changes to past medical history. There have been no recent hospitalizations. They are following a well balanced diet and exercise plan. Weight has been increasing steadily. She is frustrated with her weight gain and would like to discuss this.   Colonoscopy: routine screening  Mammogram: routine screening  Pap smear: 03/2018 Flu/tdap: done   Weight gain: lowest weight on saxenda was 170 pounds. She is very frustrated with this and wants to see someone or needs help with this. Not exercising as much as she used to and diet is not clean with sugar.   Anxiety: currently on prozac 10mg /day. Has been hesitant to increase this dosage in the past, but has anxiety daily. Interested in increasing this today.   Review of Systems  Constitutional: Positive for fatigue. Negative for activity change and appetite change.  HENT: Negative for sinus pressure, sinus pain and sore throat.   Eyes: Negative for pain, redness and itching.  Respiratory: Negative for cough, choking and shortness of breath.   Cardiovascular: Negative for chest pain, palpitations and leg swelling.  Gastrointestinal: Negative for diarrhea, nausea and vomiting.  Endocrine: Positive for cold intolerance. Negative for heat intolerance and polydipsia.  Genitourinary: Negative for frequency, pelvic pain and urgency.  Musculoskeletal: Negative for back pain and joint swelling.  Skin: Negative for pallor, rash and  wound.  Allergic/Immunologic: Negative for environmental allergies and food allergies.  Neurological: Negative for dizziness, light-headedness and headaches.  Psychiatric/Behavioral: Negative for confusion and self-injury. The patient is nervous/anxious.     Allergies Patient is allergic to azithromycin.  Past Medical History Patient  has a past medical history of ADHD (attention deficit hyperactivity disorder), Anxiety, Frequent UTI, Hyperlipidemia, IC (interstitial cystitis), and PCOS (polycystic ovarian syndrome).  Surgical History Patient  has a past surgical history that includes Wisdom tooth extraction and Tympanostomy tube placement (Bilateral, 03/2013).  Family History Pateint's family history includes Cancer in her maternal grandmother; Celiac disease in her father; Diabetes in her paternal uncle; Drug abuse in her paternal uncle; Hypertension in her mother.  Social History Patient  reports that she has never smoked. She has never used smokeless tobacco. She reports that she does not drink alcohol or use drugs.    Objective: Vitals:   02/18/19 1008  BP: (!) 90/54  Pulse: 91  Temp: 97.7 F (36.5 C)  TempSrc: Temporal  SpO2: 97%  Weight: 184 lb (83.5 kg)  Height: 5\' 7"  (1.702 m)    Body mass index is 28.82 kg/m.  Physical Exam Vitals reviewed.  Constitutional:      Appearance: Normal appearance. She is well-developed.  HENT:     Head: Normocephalic and atraumatic.     Right Ear: Tympanic membrane, ear canal and external ear normal.     Left Ear: Tympanic membrane, ear canal and external ear normal.     Ears:     Comments: Tubes in place     Nose:  Nose normal.     Mouth/Throat:     Mouth: Mucous membranes are moist.  Eyes:     Extraocular Movements: Extraocular movements intact.     Conjunctiva/sclera: Conjunctivae normal.     Pupils: Pupils are equal, round, and reactive to light.  Neck:     Thyroid: No thyromegaly.  Cardiovascular:     Rate and Rhythm:  Normal rate and regular rhythm.     Pulses: Normal pulses.     Heart sounds: Normal heart sounds. No murmur.  Pulmonary:     Effort: Pulmonary effort is normal.     Breath sounds: Normal breath sounds.  Abdominal:     General: Abdomen is flat. Bowel sounds are normal. There is no distension.     Palpations: Abdomen is soft.     Tenderness: There is no abdominal tenderness.  Musculoskeletal:     Cervical back: Normal range of motion and neck supple.  Lymphadenopathy:     Cervical: No cervical adenopathy.  Skin:    General: Skin is warm and dry.     Capillary Refill: Capillary refill takes less than 2 seconds.     Findings: No rash.  Neurological:     General: No focal deficit present.     Mental Status: She is alert and oriented to person, place, and time.     Cranial Nerves: No cranial nerve deficit.     Coordination: Coordination normal.     Deep Tendon Reflexes: Reflexes normal.  Psychiatric:        Mood and Affect: Mood normal.        Behavior: Behavior normal.      GAD 7 : Generalized Anxiety Score 02/18/2019 11/30/2017 10/29/2017  Nervous, Anxious, on Edge 3 1 2   Control/stop worrying 3 1 2   Worry too much - different things 3 1 2   Trouble relaxing 2 1 1   Restless 0 0 1  Easily annoyed or irritable 3 2 2   Afraid - awful might happen 1 1 0  Total GAD 7 Score 15 7 10   Anxiety Difficulty Somewhat difficult Not difficult at all Somewhat difficult        Assessment/plan: 1. Annual physical exam Will come back for fasting labs. utd on her HM. Discussed proper cardio exercise and proper diet which is likely why not losing 10 pounds, encouraged her on these things. Discussed I do not do weight loss management and her BMI does not qualify her for weight clinic here as previously discussed. F/u in one year or as needed.  Patient counseling [x]    Nutrition: Stressed importance of moderation in sodium/caffeine intake, saturated fat and cholesterol, caloric balance, sufficient  intake of fresh fruits, vegetables, fiber, calcium, iron, and 1 mg of folate supplement per day (for females capable of pregnancy).  [x]    Stressed the importance of regular exercise.   []    Substance Abuse: Discussed cessation/primary prevention of tobacco, alcohol, or other drug use; driving or other dangerous activities under the influence; availability of treatment for abuse.   [x]    Injury prevention: Discussed safety belts, safety helmets, smoke detector, smoking near bedding or upholstery.   [x]    Sexuality: Discussed sexually transmitted diseases, partner selection, use of condoms, avoidance of unintended pregnancy  and contraceptive alternatives.  [x]    Dental health: Discussed importance of regular tooth brushing, flossing, and dental visits.  [x]    Health maintenance and immunizations reviewed. Please refer to Health maintenance section.    - CBC with Differential/Platelet; Future - Comprehensive  metabolic panel; Future - Lipid panel; Future - TSH; Future - VITAMIN D 25 Hydroxy (Vit-D Deficiency, Fractures); Future  2. GAD (generalized anxiety disorder) GAD7 score moderately severe and significantly increased since last GAD7 was given. We are going to increase her prozac to 20mg  and see her back in one months time. Try to work on exercise as well. F/u in one months time.   3. Primary insomnia Could not tolerate trazodone, made her too groggy. We will do trial of ashwagandha and see if this helps her.      Total time of encounter: 30 minutes total time of encounter, including 25 minutes spent in face-to-face patient care. This time includes coordination of care and counseling regarding HM, labs, weight loss and plan of care for anxiety/med change and insomnia. Remainder of non-face-to-face time involved reviewing chart documents/testing relevant to the patient encounter and documentation in the medical record.  This visit occurred during the SARS-CoV-2 public health emergency.   Safety protocols were in place, including screening questions prior to the visit, additional usage of staff PPE, and extensive cleaning of exam room while observing appropriate contact time as indicated for disinfecting solutions.    Return in about 1 week (around 02/25/2019) for anxiety follow up. make sure and come in for fasting labs! 04/25/2019, MD [place] [facility]   02/18/2019

## 2019-02-18 NOTE — Patient Instructions (Addendum)
ashwagandha for sleep. Can get at herbal store. Take 300mg  about 30 min-1 hour before bed.  Natural aide for sleep.   Increasing prozac 20mg /day. Sent in new px. See you back in one month.   -try to change up cardio. Like spinning, rowing, elipitical, high intensity. I like peloton. Can get app or even orange theory is good. 30-45 min/day for 5 days/week  -diet needs to be changed: work on cleaner eating and cut out sugar!   See you back in one month.  Come back in for labs fasting in next week or two   , MD [place] [facility]

## 2019-04-01 ENCOUNTER — Encounter: Payer: Self-pay | Admitting: Certified Nurse Midwife

## 2019-04-08 ENCOUNTER — Ambulatory Visit: Payer: BC Managed Care – PPO | Admitting: Family Medicine

## 2019-04-14 ENCOUNTER — Encounter: Payer: Self-pay | Admitting: Family Medicine

## 2019-04-14 ENCOUNTER — Ambulatory Visit (INDEPENDENT_AMBULATORY_CARE_PROVIDER_SITE_OTHER): Payer: BC Managed Care – PPO | Admitting: Family Medicine

## 2019-04-14 ENCOUNTER — Other Ambulatory Visit: Payer: Self-pay

## 2019-04-14 VITALS — BP 100/60 | HR 70 | Temp 98.0°F | Ht 67.0 in | Wt 181.6 lb

## 2019-04-14 DIAGNOSIS — F411 Generalized anxiety disorder: Secondary | ICD-10-CM | POA: Diagnosis not present

## 2019-04-14 MED ORDER — FLUOXETINE HCL 20 MG PO CAPS: 20.0000 mg | ORAL_CAPSULE | Freq: Every day | ORAL | 1 refills | Status: DC

## 2019-04-14 NOTE — Progress Notes (Addendum)
Patient: Misty Sanchez MRN: [id number] DOB: [birth year] PCP: Orland Mustard, MD     Subjective:  Chief Complaint  Patient presents with  . Anxiety    1 month f/u; Pt says that the Prozac is working.    HPI: The patient is a 39 y.o. female who presents today for anxiety. I increaesd her prozac to 20mg  at her last visit in February. She is taking as prescribed and has no issues with this. She also is meeting with a wellness coach at work and a nutritionist at work. She has lost nearly 8 pounds and is much happier with this. She is really work on diet and exercise as her weight is a big source of her anxiety.   She has not come in for her fasting labs yet after we did her annual in February.   Review of Systems  Constitutional: Negative for chills, fatigue and fever.  HENT: Negative for sinus pressure, sneezing and sore throat.   Cardiovascular: Negative for chest pain and palpitations.  Gastrointestinal: Negative for abdominal pain.  Genitourinary: Negative for menstrual problem.  Neurological: Negative for dizziness, light-headedness and headaches.  Psychiatric/Behavioral: Negative for behavioral problems, decreased concentration, dysphoric mood, sleep disturbance and suicidal ideas. The patient is not nervous/anxious.     Allergies Patient is allergic to azithromycin.  Past Medical History Patient  has a past medical history of ADHD (attention deficit hyperactivity disorder), Anxiety, Frequent UTI, Hyperlipidemia, IC (interstitial cystitis), and PCOS (polycystic ovarian syndrome).  Surgical History Patient  has a past surgical history that includes Wisdom tooth extraction and Tympanostomy tube placement (Bilateral, 03/2013).  Family History Pateint's family history includes Cancer in her maternal grandmother; Celiac disease in her father; Diabetes in her paternal uncle; Drug abuse in her paternal uncle; Hypertension in her mother.  Social History Patient  reports that she has  never smoked. She has never used smokeless tobacco. She reports that she does not drink alcohol or use drugs.    Objective: Vitals:   04/14/19 1002  BP: 100/60  Pulse: 70  Temp: 98 F (36.7 C)  TempSrc: Temporal  SpO2: 98%  Weight: 181 lb 9.6 oz (82.4 kg)  Height: 5\' 7"  (1.702 m)    Body mass index is 28.44 kg/m.  Physical Exam Vitals reviewed.  Constitutional:      Appearance: Normal appearance. She is normal weight.  HENT:     Head: Normocephalic and atraumatic.  Cardiovascular:     Rate and Rhythm: Normal rate and regular rhythm.     Heart sounds: Normal heart sounds.  Pulmonary:     Effort: Pulmonary effort is normal.     Breath sounds: Normal breath sounds.  Abdominal:     General: Abdomen is flat. Bowel sounds are normal.     Palpations: Abdomen is soft.  Neurological:     General: No focal deficit present.     Mental Status: She is alert and oriented to person, place, and time.  Psychiatric:        Mood and Affect: Mood normal.        Behavior: Behavior normal.        GAD 7 : Generalized Anxiety Score 04/14/2019 02/18/2019 11/30/2017 10/29/2017  Nervous, Anxious, on Edge 1 3 1 2   Control/stop worrying 1 3 1 2   Worry too much - different things 1 3 1 2   Trouble relaxing 1 2 1 1   Restless 0 0 0 1  Easily annoyed or irritable 1 3 2 2   Afraid -  awful might happen 0 1 1 0  Total GAD 7 Score 5 15 7 10   Anxiety Difficulty Not difficult at all Somewhat difficult Not difficult at all Somewhat difficult     Assessment/plan: 1. GAD (generalized anxiety disorder) GAD7 score is significantly improved. Doing great on her increased prozac and I think her weight loss is also really motivating. Proud of her. F/u in 6 months or sooner if needed. Continue wellness coaching, weight loss journey and exercise. Come in for fasting labs at her own convenience.     This visit occurred during the SARS-CoV-2 public health emergency.  Safety protocols were in place, including  screening questions prior to the visit, additional usage of staff PPE, and extensive cleaning of exam room while observing appropriate contact time as indicated for disinfecting solutions.     Return in about 6 months (around 10/14/2019) for anxiety .    Orma Flaming, MD [place]   04/14/2019

## 2019-04-14 NOTE — Patient Instructions (Signed)
email me when ready for fasting labs.   im so proud of you! Doing awesome!!!!! Keep up the good work.   Happy easter,  Dr. Artis Flock

## 2019-10-12 ENCOUNTER — Telehealth: Payer: Self-pay

## 2019-10-12 MED ORDER — FLUOXETINE HCL 20 MG PO CAPS: 20.0000 mg | ORAL_CAPSULE | Freq: Every day | ORAL | 1 refills | Status: DC

## 2019-10-12 NOTE — Telephone Encounter (Signed)
°  LAST APPOINTMENT DATE: Visit date not found   NEXT APPOINTMENT DATE:@10 /13/2021  MEDICATION: FLUoxetine (PROZAC) 20 MG capsule  PHARMACY:[id number] - [place], [place] - [address]  COMMENTS: Called patient to rschd appointment and she mentioned that she is about out of this medication and will not have enough to last until the appt   Please advise

## 2019-10-14 ENCOUNTER — Ambulatory Visit: Payer: BC Managed Care – PPO | Admitting: Family Medicine

## 2019-10-26 ENCOUNTER — Other Ambulatory Visit: Payer: Self-pay

## 2019-10-26 ENCOUNTER — Ambulatory Visit: Payer: BC Managed Care – PPO | Admitting: Family Medicine

## 2019-10-26 ENCOUNTER — Encounter: Payer: Self-pay | Admitting: Family Medicine

## 2019-10-26 VITALS — BP 100/78 | HR 75 | Temp 97.9°F | Ht 67.0 in | Wt 186.2 lb

## 2019-10-26 DIAGNOSIS — Z1159 Encounter for screening for other viral diseases: Secondary | ICD-10-CM

## 2019-10-26 DIAGNOSIS — Z0000 Encounter for general adult medical examination without abnormal findings: Secondary | ICD-10-CM

## 2019-10-26 DIAGNOSIS — F411 Generalized anxiety disorder: Secondary | ICD-10-CM

## 2019-10-26 NOTE — Progress Notes (Signed)
Patient: Misty Sanchez MRN: [id number] DOB: [birth year] PCP: Orland Mustard, MD     Subjective:  Chief Complaint  Patient presents with  . Anxiety    HPI: The patient is a 39 y.o. female who presents today for Anxiety. Pt says that she is doing well on Prozac. She is currently on prozac 20mg . She has a lot going on. She sold her house and is closing on it and bought a new house this summer. She feels like her anxiety is more situational. She is a worry wart. Her weight is a big anxiety provoking issue. No side effects with the prozac and feels like it works well. No desire to change medication.   She also is gaining weight which frustrates her. She did buy a spin bike and had to stop exercising due to hurting her elbow. She continues to see a nutritionist.   Never did annual labs. Has been over 1.5 years. Did eat today and donate plasma, but can not come at other times.   Review of Systems  Respiratory: Negative for shortness of breath and wheezing.   Cardiovascular: Negative for chest pain and palpitations.  Neurological: Negative for dizziness and headaches.  Psychiatric/Behavioral: The patient is nervous/anxious.        Pt is being treated with Prozac.    Allergies Patient is allergic to azithromycin.  Past Medical History Patient  has a past medical history of ADHD (attention deficit hyperactivity disorder), Anxiety, Frequent UTI, Hyperlipidemia, IC (interstitial cystitis), and PCOS (polycystic ovarian syndrome).  Surgical History Patient  has a past surgical history that includes Wisdom tooth extraction and Tympanostomy tube placement (Bilateral, 03/2013).  Family History Pateint's family history includes Cancer in her maternal grandmother; Celiac disease in her father; Diabetes in her paternal uncle; Drug abuse in her paternal uncle; Hypertension in her mother.  Social History Patient  reports that she has never smoked. She has never used smokeless tobacco. She reports  that she does not drink alcohol and does not use drugs.    Objective: Vitals:   10/26/19 1033  BP: 100/78  Pulse: 75  Temp: 97.9 F (36.6 C)  TempSrc: Temporal  SpO2: 98%  Weight: 186 lb 3.2 oz (84.5 kg)  Height: 5\' 7"  (1.702 m)    Body mass index is 29.16 kg/m.  Physical Exam Vitals reviewed.  Constitutional:      Appearance: Normal appearance. She is normal weight.  HENT:     Head: Normocephalic and atraumatic.  Cardiovascular:     Rate and Rhythm: Normal rate and regular rhythm.     Heart sounds: Normal heart sounds.  Pulmonary:     Effort: Pulmonary effort is normal.     Breath sounds: Normal breath sounds.  Abdominal:     General: Abdomen is flat. Bowel sounds are normal.     Palpations: Abdomen is soft.  Neurological:     General: No focal deficit present.     Mental Status: She is alert and oriented to person, place, and time.  Psychiatric:        Mood and Affect: Mood normal.        Behavior: Behavior normal.        GAD 7 : Generalized Anxiety Score 10/26/2019 04/14/2019 02/18/2019 11/30/2017  Nervous, Anxious, on Edge 1 1 3 1   Control/stop worrying 2 1 3 1   Worry too much - different things 1 1 3 1   Trouble relaxing 1 1 2 1   Restless 1 0 0 0  Easily annoyed  or irritable 2 1 3 2   Afraid - awful might happen 0 0 1 1  Total GAD 7 Score 8 5 15 7   Anxiety Difficulty Somewhat difficult Not difficult at all Somewhat difficult Not difficult at all     Assessment/plan: 1. GAD (generalized anxiety disorder) GAD7 score increased, but still much better and controlled compared to prior to medication. Continue current course and exercise. Did suggest counseling, but she isn't really interested. F/u in 6-12 months.   -annual labs today even though not fasting. She can't come in first thing in the AM and it's been 1.5 years+ since labs were done.    This visit occurred during the SARS-CoV-2 public health emergency.  Safety protocols were in place, including  screening questions prior to the visit, additional usage of staff PPE, and extensive cleaning of exam room while observing appropriate contact time as indicated for disinfecting solutions.     Return if symptoms worsen or fail to improve.    , MD [place] [facility]   10/26/2019

## 2019-10-26 NOTE — Patient Instructions (Signed)
-  labs today. Don't freak out if sugar and TG are high on cholesterol since you ate!   -continue prozac. Please let me know if you are feeling more anxious.  -f/u in 6-12 months.

## 2019-10-27 LAB — CBC WITH DIFFERENTIAL/PLATELET
Absolute Monocytes: 583 cells/uL (ref 200–950)
Basophils Absolute: 52 cells/uL (ref 0–200)
Basophils Relative: 0.6 %
Eosinophils Absolute: 226 cells/uL (ref 15–500)
Eosinophils Relative: 2.6 %
HCT: 44.6 % (ref 35.0–45.0)
Hemoglobin: 14.9 g/dL (ref 11.7–15.5)
Lymphs Abs: 2227 cells/uL (ref 850–3900)
MCH: 30.1 pg (ref 27.0–33.0)
MCHC: 33.4 g/dL (ref 32.0–36.0)
MCV: 90.1 fL (ref 80.0–100.0)
MPV: 10.7 fL (ref 7.5–12.5)
Monocytes Relative: 6.7 %
Neutro Abs: 5612 cells/uL (ref 1500–7800)
Neutrophils Relative %: 64.5 %
Platelets: 356 10*3/uL (ref 140–400)
RBC: 4.95 10*6/uL (ref 3.80–5.10)
RDW: 12.6 % (ref 11.0–15.0)
Total Lymphocyte: 25.6 %
WBC: 8.7 10*3/uL (ref 3.8–10.8)

## 2019-10-27 LAB — HEPATITIS C ANTIBODY
Hepatitis C Ab: NONREACTIVE
SIGNAL TO CUT-OFF: 0 (ref <1.00)

## 2019-10-27 LAB — COMPLETE METABOLIC PANEL WITH GFR
AG Ratio: 2.5 (calc) (ref 1.0–2.5)
ALT: 18 U/L (ref 6–29)
AST: 14 U/L (ref 10–30)
Albumin: 3.7 g/dL (ref 3.6–5.1)
Alkaline phosphatase (APISO): 51 U/L (ref 31–125)
BUN: 15 mg/dL (ref 7–25)
CO2: 28 mmol/L (ref 20–32)
Calcium: 8.5 mg/dL — ABNORMAL LOW (ref 8.6–10.2)
Chloride: 107 mmol/L (ref 98–110)
Creat: 0.73 mg/dL (ref 0.50–1.10)
GFR, Est African American: 122 mL/min/{1.73_m2} (ref >=60)
GFR, Est Non African American: 105 mL/min/{1.73_m2} (ref >=60)
Globulin: 1.5 g/dL (calc) — ABNORMAL LOW (ref 1.9–3.7)
Glucose, Bld: 63 mg/dL — ABNORMAL LOW (ref 65–99)
Potassium: 4.8 mmol/L (ref 3.5–5.3)
Sodium: 140 mmol/L (ref 135–146)
Total Bilirubin: 0.4 mg/dL (ref 0.2–1.2)
Total Protein: 5.2 g/dL — ABNORMAL LOW (ref 6.1–8.1)

## 2019-10-27 LAB — LIPID PANEL
Cholesterol: 186 mg/dL (ref <200)
HDL: 32 mg/dL — ABNORMAL LOW (ref >=50)
LDL Cholesterol (Calc): 114 mg/dL (calc) — ABNORMAL HIGH
Non-HDL Cholesterol (Calc): 154 mg/dL (calc) — ABNORMAL HIGH (ref <130)
Total CHOL/HDL Ratio: 5.8 (calc) — ABNORMAL HIGH (ref <5.0)
Triglycerides: 300 mg/dL — ABNORMAL HIGH (ref <150)

## 2019-10-27 LAB — TSH: TSH: 1.31 mIU/L

## 2019-12-16 ENCOUNTER — Other Ambulatory Visit: Payer: Self-pay | Admitting: Family Medicine

## 2019-12-16 DIAGNOSIS — R000 Tachycardia, unspecified: Secondary | ICD-10-CM

## 2020-04-09 ENCOUNTER — Telehealth: Payer: Self-pay

## 2020-04-09 MED ORDER — FLUOXETINE HCL 20 MG PO CAPS
20.0000 mg | ORAL_CAPSULE | Freq: Every day | ORAL | 1 refills | Status: DC
Start: 2020-04-09 — End: 2020-10-05

## 2020-04-09 NOTE — Telephone Encounter (Signed)
.   LAST APPOINTMENT DATE: 12/16/2019   NEXT APPOINTMENT DATE:@Visit  date not found  MEDICATION:FLUoxetine (PROZAC) 20 MG capsule   PHARMACY:[facility] - [place], [place] - [address]    CLINICAL FILLS OUT ALL BELOW:   LAST REFILL:  QTY:  REFILL DATE:    OTHER COMMENTS:    Okay for refill?  Please advise

## 2020-04-13 ENCOUNTER — Ambulatory Visit: Payer: BC Managed Care – PPO

## 2020-04-13 ENCOUNTER — Other Ambulatory Visit: Payer: Self-pay

## 2020-04-13 ENCOUNTER — Ambulatory Visit (INDEPENDENT_AMBULATORY_CARE_PROVIDER_SITE_OTHER): Payer: BC Managed Care – PPO | Admitting: Podiatry

## 2020-04-13 DIAGNOSIS — M722 Plantar fascial fibromatosis: Secondary | ICD-10-CM

## 2020-04-13 DIAGNOSIS — B351 Tinea unguium: Secondary | ICD-10-CM

## 2020-04-13 MED ORDER — MELOXICAM 15 MG PO TABS: 15.0000 mg | ORAL_TABLET | Freq: Every day | ORAL | 0 refills | Status: DC

## 2020-04-13 NOTE — Progress Notes (Signed)
  Subjective:  Patient ID: Misty Sanchez, female    DOB: 01-08-1981,  MRN: [id number]  Chief Complaint  Patient presents with  . Nail Problem    Possible nail fungus bilateral great toe nail. Pt believe left great toenail is releasing from nail bed.     40 y.o. female presents with the above complaint. History confirmed with patient.  Has been using Kerasal on her nail states that she caught her toe on a cabinet.  Objective:  Physical Exam: warm, good capillary refill, no trophic changes or ulcerative lesions, normal DP and PT pulses and normal sensory exam.  Pain palpation about the medial calcaneal tuber bilateral.  Partial onycholysis left great toenail   Assessment:   1. Plantar fasciitis, bilateral   2. Onychomycosis    Plan:  Patient was evaluated and treated and all questions answered.  Onycholysis -Nail gently debrided slant back fashion to patient relief  Plantar Fasciitis -XR reviewed with patient -Educated patient on stretching and icing of the affected limb -Plantar fascial brace dispensed -Rx for meloxicam. Educated on use, risks and benefits of the medication  Return in about 1 month (around 05/13/2020) for Plantar fasciitis, with XRs.

## 2020-04-13 NOTE — Patient Instructions (Signed)

## 2020-05-18 ENCOUNTER — Encounter: Payer: Self-pay | Admitting: Podiatry

## 2020-05-18 ENCOUNTER — Ambulatory Visit (INDEPENDENT_AMBULATORY_CARE_PROVIDER_SITE_OTHER): Payer: BC Managed Care – PPO

## 2020-05-18 ENCOUNTER — Other Ambulatory Visit: Payer: Self-pay

## 2020-05-18 ENCOUNTER — Ambulatory Visit: Payer: BC Managed Care – PPO | Admitting: Podiatry

## 2020-05-18 DIAGNOSIS — M722 Plantar fascial fibromatosis: Secondary | ICD-10-CM

## 2020-05-18 MED ORDER — MELOXICAM 15 MG PO TABS: 15.0000 mg | ORAL_TABLET | Freq: Every day | ORAL | 0 refills | Status: DC

## 2020-05-18 MED ORDER — BETAMETHASONE SOD PHOS & ACET 6 (3-3) MG/ML IJ SUSP
6.0000 mg | Freq: Once | INTRAMUSCULAR | Status: AC
  Administered 2020-05-18: 6 mg

## 2020-05-18 NOTE — Progress Notes (Signed)
  Subjective:  Patient ID: Misty Sanchez, female    DOB: 1980-09-22,  MRN: [id number]  Chief Complaint  Patient presents with  . Plantar Fasciitis    Follow up bilateral heels  "I have noticed more pain since completing the medication"    40 y.o. female presents with the above complaint. History confirmed with patient.   Objective:  Physical Exam: warm, good capillary refill, no trophic changes or ulcerative lesions, normal DP and PT pulses and normal sensory exam. Left Foot: tenderness to palpation medial calcaneal tuber, no pain with calcaneal squeeze, decreased ankle joint ROM and +Silverskiold test Right Foot: mild tenderness to palpation medial calcaneal tuber, no pain with calcaneal squeeze, decreased ankle joint ROM and +Silverskiold test  Radiographs: X-ray of both feet: no evidence of calcaneal stress fracture, plantar calcaneal spur right, posterior calcaneal spur and Haglund deformity noted, cavus foot type  Assessment:   1. Plantar fasciitis, bilateral    Plan:  Patient was evaluated and treated and all questions answered.  Plantar Fasciitis -XR reviewed with patient -Educated patient on stretching and icing of the affected limb -Injection delivered to the plantar fascia of the right foot.  Procedure: Injection Tendon/Ligament Consent: Verbal consent obtained. Location: Right plantar fascia at the glabrous junction; medial approach. Skin Prep: Alcohol. Injectate: 1 cc 0.5% marcaine plain, 1 cc betamethasone acetate-betamethasone sodium phosphate Disposition: Patient tolerated procedure well. Injection site dressed with a band-aid.  Return in about 3 weeks (around 06/08/2020) for Plantar fasciitis.

## 2020-06-19 ENCOUNTER — Ambulatory Visit: Payer: BC Managed Care – PPO | Admitting: Podiatry

## 2020-07-09 ENCOUNTER — Telehealth: Payer: Self-pay

## 2020-07-09 NOTE — Telephone Encounter (Signed)
  LAST APPOINTMENT DATE: 04/09/2020   NEXT APPOINTMENT DATE:@7 /13/2022  MEDICATION: FLUoxetine (PROZAC) 20 MG capsule  PHARMACY:[facility] - [place], [place] - [address]  Please advise

## 2020-07-10 NOTE — Telephone Encounter (Signed)
Called to make pt aware that there are refills at her pharmacy.

## 2020-07-25 ENCOUNTER — Other Ambulatory Visit: Payer: Self-pay

## 2020-07-25 ENCOUNTER — Encounter: Payer: Self-pay | Admitting: Physician Assistant

## 2020-07-25 ENCOUNTER — Ambulatory Visit: Payer: BC Managed Care – PPO | Admitting: Physician Assistant

## 2020-07-25 VITALS — BP 112/79 | HR 68 | Temp 97.8°F | Ht 67.0 in | Wt 187.5 lb

## 2020-07-25 DIAGNOSIS — L309 Dermatitis, unspecified: Secondary | ICD-10-CM | POA: Diagnosis not present

## 2020-07-25 DIAGNOSIS — F411 Generalized anxiety disorder: Secondary | ICD-10-CM

## 2020-07-25 DIAGNOSIS — E282 Polycystic ovarian syndrome: Secondary | ICD-10-CM | POA: Diagnosis not present

## 2020-07-25 MED ORDER — TRIAMCINOLONE ACETONIDE 0.1 % EX CREA: 1.0000 "application " | TOPICAL_CREAM | Freq: Two times a day (BID) | CUTANEOUS | 0 refills | Status: DC

## 2020-07-25 NOTE — Patient Instructions (Signed)
Good to meet you today! Keep up the great work with diet and exercise!  Try the triamcinolone cream for the area on your finger. Do not use for more than 2 weeks at a time.   Call as needed for refills. See you back in 6 months for med check.

## 2020-07-25 NOTE — Progress Notes (Signed)
Established Patient Office Visit  Subjective:  Patient ID: Misty Sanchez, female    DOB: 11/14/1980  Age: 40 y.o. MRN: [id number]  CC:  Chief Complaint  Patient presents with   Transitions Of Care    HPI Misty Sanchez presents for transition of care visit from Dr. Artis Flock.   Hx of PCOS. Works with nutritionist. Still working on dietary changes. Works out daily - home exercise videos and spin bike. Has an IUD and still having some irregular cycles.  Takes Prozac 20 mg for the last few years and this helps with her anxiety. Also has propranolol 10 mg to take as needed for anxiety-provoking activities such as giving plasma. Doing well with these medications.  Has some irritation on her right middle finger that started after wearing rings for extended time. No hx of eczema that she is aware of.   Past Medical History:  Diagnosis Date   ADHD (attention deficit hyperactivity disorder)    Anxiety    Frequent UTI    Hyperlipidemia    IC (interstitial cystitis)    PCOS (polycystic ovarian syndrome)     Past Surgical History:  Procedure Laterality Date   TYMPANOSTOMY TUBE PLACEMENT Bilateral 03/2013   WISDOM TOOTH EXTRACTION      Family History  Problem Relation Age of Onset   Hypertension Mother    Celiac disease Father    Diabetes Paternal Uncle    Drug abuse Paternal Uncle        heroin   Cancer Maternal Grandmother        gland behind the ear    Social History   Socioeconomic History   Marital status: Single    Spouse name: Not on file   Number of children: Not on file   Years of education: Not on file   Highest education level: Not on file  Occupational History   Not on file  Tobacco Use   Smoking status: Never   Smokeless tobacco: Never  Vaping Use   Vaping Use: Never used  Substance and Sexual Activity   Alcohol use: No   Drug use: No   Sexual activity: Not Currently    Partners: Male    Birth control/protection: I.U.D.    Comment: Mirena IUD    Other Topics Concern   Not on file  Social History Narrative   Not on file   Social Determinants of Health   Financial Resource Strain: Not on file  Food Insecurity: Not on file  Transportation Needs: Not on file  Physical Activity: Not on file  Stress: Not on file  Social Connections: Not on file  Intimate Partner Violence: Not on file    Outpatient Medications Prior to Visit  Medication Sig Dispense Refill   Cholecalciferol (VITAMIN D PO) Take by mouth daily.     FLUoxetine (PROZAC) 20 MG capsule Take 1 capsule (20 mg total) by mouth daily. 90 capsule 1   IRON PO Take by mouth daily.     Levonorgestrel (MIRENA, 52 MG, IU) by Intrauterine route.     propranolol (INDERAL) 10 MG tablet TAKE 1 TABLET BY MOUTH ONCE DAILY AS NEEDED ONE  HOUR  PRIOR  TO  APPOINTMENT 30 tablet 0   meloxicam (MOBIC) 15 MG tablet Take 1 tablet (15 mg total) by mouth daily. 30 tablet 0   No facility-administered medications prior to visit.    Allergies  Allergen Reactions   Azithromycin     Nausea ( z-pack )  ROS Review of Systems REFER TO HPI FOR PERTINENT POSITIVES AND NEGATIVES    Objective:    Physical Exam Vitals and nursing note reviewed.  Constitutional:      General: She is not in acute distress.    Appearance: Normal appearance. She is normal weight.  HENT:     Head: Normocephalic.     Right Ear: External ear normal.     Left Ear: External ear normal.     Nose: Nose normal.     Mouth/Throat:     Mouth: Mucous membranes are moist.  Eyes:     Extraocular Movements: Extraocular movements intact.     Conjunctiva/sclera: Conjunctivae normal.     Pupils: Pupils are equal, round, and reactive to light.  Cardiovascular:     Rate and Rhythm: Normal rate and regular rhythm.     Pulses: Normal pulses.     Heart sounds: No murmur heard. Pulmonary:     Effort: Pulmonary effort is normal.     Breath sounds: Normal breath sounds.  Abdominal:     Tenderness: There is no abdominal  tenderness.  Musculoskeletal:        General: Normal range of motion.     Cervical back: Normal range of motion.  Skin:    General: Skin is warm.     Comments: Dryness / flaking and mild erythema irritation base of middle finger  Neurological:     General: No focal deficit present.     Mental Status: She is alert and oriented to person, place, and time.     Gait: Gait normal.  Psychiatric:        Mood and Affect: Mood normal.        Behavior: Behavior normal.    BP 112/79   Pulse 68   Temp 97.8 F (36.6 C)   Ht 5\' 7"  (1.702 m)   Wt 187 lb 8 oz (85 kg)   SpO2 97%   BMI 29.37 kg/m  Wt Readings from Last 3 Encounters:  07/25/20 187 lb 8 oz (85 kg)  10/26/19 186 lb 3.2 oz (84.5 kg)  04/14/19 181 lb 9.6 oz (82.4 kg)     Health Maintenance Due  Topic Date Due   HIV Screening  Never done   COVID-19 Vaccine (2 - Booster for Janssen series) 05/19/2019    There are no preventive care reminders to display for this patient.  Lab Results  Component Value Date   TSH 1.31 10/26/2019   Lab Results  Component Value Date   WBC 8.7 10/26/2019   HGB 14.9 10/26/2019   HCT 44.6 10/26/2019   MCV 90.1 10/26/2019   PLT 356 10/26/2019   Lab Results  Component Value Date   NA 140 10/26/2019   K 4.8 10/26/2019   CO2 28 10/26/2019   GLUCOSE 63 (L) 10/26/2019   BUN 15 10/26/2019   CREATININE 0.73 10/26/2019   BILITOT 0.4 10/26/2019   ALKPHOS 48 02/15/2018   AST 14 10/26/2019   ALT 18 10/26/2019   PROT 5.2 (L) 10/26/2019   ALBUMIN 3.9 02/15/2018   CALCIUM 8.5 (L) 10/26/2019   GFR 83.87 02/15/2018   Lab Results  Component Value Date   CHOL 186 10/26/2019   Lab Results  Component Value Date   HDL 32 (L) 10/26/2019   Lab Results  Component Value Date   LDLCALC 114 (H) 10/26/2019   Lab Results  Component Value Date   TRIG 300 (H) 10/26/2019   Lab Results  Component Value  Date   CHOLHDL 5.8 (H) 10/26/2019   Lab Results  Component Value Date   HGBA1C 5.2  08/25/2017      Assessment & Plan:   Problem List Items Addressed This Visit       Endocrine   PCOS (polycystic ovarian syndrome)     Other   GAD (generalized anxiety disorder)   Other Visit Diagnoses     Eczema of right hand    -  Primary       Meds ordered this encounter  Medications   triamcinolone cream (KENALOG) 0.1 %    Sig: Apply 1 application topically 2 (two) times daily.    Dispense:  30 g    Refill:  0    Follow-up: Return in about 6 months (around 01/25/2021) for med check for anxiety .   1. Eczema of right hand Use triamcinolone cream twice daily. Do not wear rings on this finger. Recheck if worse or no improvement.   2. GAD (generalized anxiety disorder) Stable on Prozac 20 mg daily. Propranolol 10 mg as needed - only uses before giving plasma.  3. PCOS (polycystic ovarian syndrome) Doing well. Works with nutritionist. Exercises daily. Has IUD to help with cycles.   Master Touchet M Clearance Chenault, PA-C

## 2020-10-05 ENCOUNTER — Other Ambulatory Visit: Payer: Self-pay | Admitting: Family Medicine

## 2020-12-31 ENCOUNTER — Ambulatory Visit: Payer: BC Managed Care – PPO | Admitting: Physician Assistant

## 2020-12-31 ENCOUNTER — Other Ambulatory Visit: Payer: Self-pay

## 2020-12-31 VITALS — BP 119/78 | HR 72 | Temp 97.8°F | Ht 67.0 in | Wt 191.4 lb

## 2020-12-31 DIAGNOSIS — F411 Generalized anxiety disorder: Secondary | ICD-10-CM | POA: Diagnosis not present

## 2020-12-31 DIAGNOSIS — R000 Tachycardia, unspecified: Secondary | ICD-10-CM | POA: Diagnosis not present

## 2020-12-31 DIAGNOSIS — Z975 Presence of (intrauterine) contraceptive device: Secondary | ICD-10-CM | POA: Diagnosis not present

## 2020-12-31 MED ORDER — PROPRANOLOL HCL 10 MG PO TABS: ORAL_TABLET | ORAL | 3 refills | Status: DC

## 2020-12-31 MED ORDER — FLUOXETINE HCL 20 MG PO CAPS: 20.0000 mg | ORAL_CAPSULE | Freq: Every day | ORAL | 3 refills | Status: DC

## 2020-12-31 NOTE — Progress Notes (Signed)
Subjective:    Patient ID: Misty Sanchez, female    DOB: [birth year], 40 y.o.   MRN: [id number]  No chief complaint on file.   HPI Patient is in today for medication recheck. She takes Prozac 20 mg daily. Propranolol 10 mg prior to donating plasma.  No side effects. No new concerns. Staying busy with work and taking care of her dad who just had back surgery.  Also needs referral to new GYN.  Past Medical History:  Diagnosis Date   ADHD (attention deficit hyperactivity disorder)    Anxiety    Frequent UTI    Hyperlipidemia    IC (interstitial cystitis)    PCOS (polycystic ovarian syndrome)     Past Surgical History:  Procedure Laterality Date   TYMPANOSTOMY TUBE PLACEMENT Bilateral 03/2013   WISDOM TOOTH EXTRACTION      Family History  Problem Relation Age of Onset   Hypertension Mother    Celiac disease Father    Diabetes Paternal Uncle    Drug abuse Paternal Uncle        heroin   Cancer Maternal Grandmother        gland behind the ear    Social History   Tobacco Use   Smoking status: Never   Smokeless tobacco: Never  Vaping Use   Vaping Use: Never used  Substance Use Topics   Alcohol use: No   Drug use: No     Allergies  Allergen Reactions   Azithromycin     Nausea ( z-pack )    Review of Systems NEGATIVE UNLESS OTHERWISE INDICATED IN HPI      Objective:     BP 119/78    Pulse 72    Temp 97.8 F (36.6 C)    Ht 5\' 7"  (1.702 m)    Wt 191 lb 6.1 oz (86.8 kg)    SpO2 97%    BMI 29.97 kg/m   Wt Readings from Last 3 Encounters:  12/31/20 191 lb 6.1 oz (86.8 kg)  07/25/20 187 lb 8 oz (85 kg)  10/26/19 186 lb 3.2 oz (84.5 kg)    BP Readings from Last 3 Encounters:  12/31/20 119/78  07/25/20 112/79  10/26/19 100/78     Physical Exam Vitals and nursing note reviewed.  Constitutional:      Appearance: Normal appearance. She is normal weight. She is not toxic-appearing.  HENT:     Head: Normocephalic and atraumatic.     Right Ear:  External ear normal.     Left Ear: External ear normal.     Nose: Nose normal.     Mouth/Throat:     Mouth: Mucous membranes are moist.  Eyes:     Extraocular Movements: Extraocular movements intact.     Conjunctiva/sclera: Conjunctivae normal.     Pupils: Pupils are equal, round, and reactive to light.  Cardiovascular:     Rate and Rhythm: Normal rate and regular rhythm.     Pulses: Normal pulses.     Heart sounds: Normal heart sounds.  Pulmonary:     Effort: Pulmonary effort is normal.     Breath sounds: Normal breath sounds.  Musculoskeletal:        General: Normal range of motion.     Cervical back: Normal range of motion and neck supple.  Skin:    General: Skin is warm and dry.  Neurological:     General: No focal deficit present.     Mental Status: She is alert and oriented  to person, place, and time.  Psychiatric:        Mood and Affect: Mood normal.        Behavior: Behavior normal.        Thought Content: Thought content normal.        Judgment: Judgment normal.       Assessment & Plan:   Problem List Items Addressed This Visit       Other   GAD (generalized anxiety disorder) - Primary   Relevant Medications   FLUoxetine (PROZAC) 20 MG capsule   Other Visit Diagnoses     Elevated pulse rate       Relevant Medications   propranolol (INDERAL) 10 MG tablet   Presence of IUD       Relevant Orders   Ambulatory referral to Gynecology        Meds ordered this encounter  Medications   FLUoxetine (PROZAC) 20 MG capsule    Sig: Take 1 capsule (20 mg total) by mouth daily.    Dispense:  90 capsule    Refill:  3   propranolol (INDERAL) 10 MG tablet    Sig: TAKE 1 TABLET BY MOUTH ONCE DAILY AS NEEDED ONE  HOUR  PRIOR  TO  APPOINTMENT    Dispense:  90 tablet    Refill:  3   1. GAD (generalized anxiety disorder) 2. Elevated pulse rate Flowsheet Row Office Visit from 12/31/2020 in [facility]  PHQ-9 Total Score 7      GAD 7 :  Generalized Anxiety Score 12/31/2020 10/26/2019 04/14/2019 02/18/2019  Nervous, Anxious, on Edge 1 1 1 3   Control/stop worrying 1 2 1 3   Worry too much - different things 1 1 1 3   Trouble relaxing 1 1 1 2   Restless 0 1 0 0  Easily annoyed or irritable 1 2 1 3   Afraid - awful might happen 0 0 0 1  Total GAD 7 Score 5 8 5 15   Anxiety Difficulty Somewhat difficult Somewhat difficult Not difficult at all Somewhat difficult    -Stable, doing well on current regimen with Prozac 20 mg and Propranolol 10 mg as needed. Refilled for her today.  -Plan to f/up in about 1 year or sooner prn  3. Presence of IUD -Referral to GYN   Eryka Dolinger M Osmar Howton, PA-C

## 2020-12-31 NOTE — Patient Instructions (Addendum)
Good to see you again today. Your prescriptions were refilled. Referral sent to GYN as well for new establishment. Keep up good work! Call if any concerns!

## 2021-02-26 ENCOUNTER — Encounter: Payer: Self-pay | Admitting: Physician Assistant

## 2021-02-26 NOTE — Telephone Encounter (Signed)
Please advise 

## 2021-03-04 ENCOUNTER — Ambulatory Visit: Payer: BC Managed Care – PPO | Admitting: Physician Assistant

## 2021-03-04 ENCOUNTER — Other Ambulatory Visit: Payer: Self-pay

## 2021-03-04 ENCOUNTER — Encounter: Payer: Self-pay | Admitting: Physician Assistant

## 2021-03-04 VITALS — BP 113/77 | HR 73 | Temp 97.6°F | Ht 67.0 in | Wt 197.2 lb

## 2021-03-04 DIAGNOSIS — F411 Generalized anxiety disorder: Secondary | ICD-10-CM

## 2021-03-04 DIAGNOSIS — R635 Abnormal weight gain: Secondary | ICD-10-CM

## 2021-03-04 DIAGNOSIS — F902 Attention-deficit hyperactivity disorder, combined type: Secondary | ICD-10-CM | POA: Diagnosis not present

## 2021-03-04 MED ORDER — BUPROPION HCL ER (XL) 150 MG PO TB24: 150.0000 mg | ORAL_TABLET | Freq: Every day | ORAL | 2 refills | Status: DC

## 2021-03-04 NOTE — Progress Notes (Signed)
Subjective:    Patient ID: Misty Sanchez, female    DOB: [birth year], 41 y.o.   MRN: [id number]  Chief Complaint  Patient presents with   Depression   Weight Gain     Patient is in today for discussion about weight gain and depression /anxiety. 6 lbs increased in the last month.  Exercises four times per week, works out at home. Has been doing exercise for years.  Sundays - 1.5 hrs - videos / bike M-W - 40 mins on bike, and usually a video prior to this Th-Sat - off  Usually has free weights during the videos.   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 Did not meet in January.   Donates plasma on a regular basis (twice per week).   Has been on Prozac since 2018 - started by her OB/GYN.   Having headaches more frequently. No hx of migraines.  Says she feels like she is constantly hungry.  Drinks water all day. Does not drink sodas. Sweet tea only one day per week.   IUD x 4 years; came off dep shot b/c thought that might be related to weight increase.   Prior history of ADHD in high school and took medication to help with focus. Gave her headaches several years ago when she tried to get back onto it. Ritalin as a child. Metadate when older.   Past Medical History:  Diagnosis Date   ADHD (attention deficit hyperactivity disorder)    Anxiety    Frequent UTI    Hyperlipidemia    IC (interstitial cystitis)    PCOS (polycystic ovarian syndrome)     Past Surgical History:  Procedure Laterality Date   TYMPANOSTOMY TUBE PLACEMENT Bilateral 03/2013   WISDOM TOOTH EXTRACTION      Family History  Problem Relation Age of Onset   Hypertension Mother    Celiac disease Father    Diabetes Paternal Uncle    Drug abuse Paternal Uncle        heroin   Cancer Maternal Grandmother        gland behind the ear    Social History   Tobacco Use   Smoking status: Never   Smokeless tobacco: Never  Vaping Use   Vaping Use: Never  used  Substance Use Topics   Alcohol use: No   Drug use: No     Allergies  Allergen Reactions   Azithromycin     Nausea ( z-pack )    Review of Systems  Psychiatric/Behavioral:  Positive for depression.   NEGATIVE UNLESS OTHERWISE INDICATED IN HPI      Objective:     BP 113/77    Pulse 73    Temp 97.6 F (36.4 C)    Ht 5\' 7"  (1.702 m)    Wt 197 lb 4 oz (89.5 kg)    SpO2 98%    BMI 30.89 kg/m   Wt Readings from Last 3 Encounters:  03/04/21 197 lb 4 oz (89.5 kg)  12/31/20 191 lb 6.1 oz (86.8 kg)  07/25/20 187 lb 8 oz (85 kg)    BP Readings from Last 3 Encounters:  03/04/21 113/77  12/31/20 119/78  07/25/20 112/79     Physical Exam Vitals and nursing note reviewed.  Constitutional:      Appearance: Normal appearance. She is normal weight. She is not toxic-appearing.  HENT:     Head: Normocephalic and atraumatic.     Right  Ear: Tympanic membrane, ear canal and external ear normal.     Left Ear: Tympanic membrane, ear canal and external ear normal.     Nose: Nose normal.     Mouth/Throat:     Mouth: Mucous membranes are moist.  Eyes:     Extraocular Movements: Extraocular movements intact.     Conjunctiva/sclera: Conjunctivae normal.     Pupils: Pupils are equal, round, and reactive to light.  Neck:     Thyroid: No thyroid mass, thyromegaly or thyroid tenderness.  Cardiovascular:     Rate and Rhythm: Normal rate and regular rhythm.     Pulses: Normal pulses.     Heart sounds: Normal heart sounds.  Pulmonary:     Effort: Pulmonary effort is normal.     Breath sounds: Normal breath sounds.  Abdominal:     General: Abdomen is flat. Bowel sounds are normal.     Palpations: Abdomen is soft.  Musculoskeletal:        General: Normal range of motion.     Cervical back: Normal range of motion and neck supple.  Lymphadenopathy:     Cervical: No cervical adenopathy.  Skin:    General: Skin is warm and dry.  Neurological:     General: No focal deficit present.      Mental Status: She is alert and oriented to person, place, and time.  Psychiatric:        Mood and Affect: Mood normal.        Behavior: Behavior normal.        Thought Content: Thought content normal.        Judgment: Judgment normal.       Assessment & Plan:   Problem List Items Addressed This Visit       Other   GAD (generalized anxiety disorder) - Primary   Relevant Medications   buPROPion (WELLBUTRIN XL) 150 MG 24 hr tablet   Other Visit Diagnoses     Attention deficit hyperactivity disorder (ADHD), combined type       Abnormal weight gain            Meds ordered this encounter  Medications   buPROPion (WELLBUTRIN XL) 150 MG 24 hr tablet    Sig: Take 1 tablet (150 mg total) by mouth daily.    Dispense:  30 tablet    Refill:  2    Plan: -Add Wellbutrin XL 150 mg to her daily Prozac 20 mg to help with energy, focus, and to hopefully help reduce sugar cravings. -Pt aware of risks vs benefits and possible adverse reactions -She will continue exercising as she is doing a great job with this. -She will cont to see nutritionist. -Need to f/up in about 4 week for fasting labs as this has not been checked since 2021  Pt to call sooner if any concerns    Hildred Pharo M Benjerman Molinelli, PA-C

## 2021-03-04 NOTE — Patient Instructions (Signed)
Continue on Prozac 20 mg. Add Wellbutrin XL 150 mg daily.  F/up in 4 weeks - call sooner if any concerns. Will check labs at next appointment fasting.

## 2021-04-01 ENCOUNTER — Ambulatory Visit: Payer: BC Managed Care – PPO | Admitting: Physician Assistant

## 2021-04-01 VITALS — BP 119/85 | HR 83 | Temp 98.6°F | Ht 67.0 in | Wt 194.2 lb

## 2021-04-01 DIAGNOSIS — E782 Mixed hyperlipidemia: Secondary | ICD-10-CM

## 2021-04-01 DIAGNOSIS — F411 Generalized anxiety disorder: Secondary | ICD-10-CM | POA: Diagnosis not present

## 2021-04-01 DIAGNOSIS — F902 Attention-deficit hyperactivity disorder, combined type: Secondary | ICD-10-CM | POA: Diagnosis not present

## 2021-04-01 DIAGNOSIS — R635 Abnormal weight gain: Secondary | ICD-10-CM | POA: Diagnosis not present

## 2021-04-01 DIAGNOSIS — E559 Vitamin D deficiency, unspecified: Secondary | ICD-10-CM

## 2021-04-01 LAB — CBC WITH DIFFERENTIAL/PLATELET
Basophils Absolute: 0.1 10*3/uL (ref 0.0–0.1)
Basophils Relative: 0.7 % (ref 0.0–3.0)
Eosinophils Absolute: 0.5 10*3/uL (ref 0.0–0.7)
Eosinophils Relative: 4.2 % (ref 0.0–5.0)
HCT: 38.5 % (ref 36.0–46.0)
Hemoglobin: 13.3 g/dL (ref 12.0–15.0)
Lymphocytes Relative: 19.3 % (ref 12.0–46.0)
Lymphs Abs: 2.1 10*3/uL (ref 0.7–4.0)
MCHC: 34.4 g/dL (ref 30.0–36.0)
MCV: 86.7 fl (ref 78.0–100.0)
Monocytes Absolute: 0.6 10*3/uL (ref 0.1–1.0)
Monocytes Relative: 5.5 % (ref 3.0–12.0)
Neutro Abs: 7.7 10*3/uL (ref 1.4–7.7)
Neutrophils Relative %: 70.3 % (ref 43.0–77.0)
Platelets: 344 10*3/uL (ref 150.0–400.0)
RBC: 4.44 Mil/uL (ref 3.87–5.11)
RDW: 13.2 % (ref 11.5–15.5)
WBC: 11 10*3/uL — ABNORMAL HIGH (ref 4.0–10.5)

## 2021-04-01 LAB — COMPREHENSIVE METABOLIC PANEL
ALT: 21 U/L (ref 0–35)
AST: 16 U/L (ref 0–37)
Albumin: 4.1 g/dL (ref 3.5–5.2)
Alkaline Phosphatase: 54 U/L (ref 39–117)
BUN: 14 mg/dL (ref 6–23)
CO2: 29 mEq/L (ref 19–32)
Calcium: 8.9 mg/dL (ref 8.4–10.5)
Chloride: 103 mEq/L (ref 96–112)
Creatinine, Ser: 0.87 mg/dL (ref 0.40–1.20)
GFR: 84.47 mL/min (ref >60.00)
Glucose, Bld: 97 mg/dL (ref 70–99)
Potassium: 4.1 mEq/L (ref 3.5–5.1)
Sodium: 139 mEq/L (ref 135–145)
Total Bilirubin: 0.5 mg/dL (ref 0.2–1.2)
Total Protein: 5.8 g/dL — ABNORMAL LOW (ref 6.0–8.3)

## 2021-04-01 LAB — LIPID PANEL
Cholesterol: 186 mg/dL (ref 0–200)
HDL: 35.6 mg/dL — ABNORMAL LOW (ref >39.00)
NonHDL: 150.4
Total CHOL/HDL Ratio: 5
Triglycerides: 208 mg/dL — ABNORMAL HIGH (ref 0.0–149.0)
VLDL: 41.6 mg/dL — ABNORMAL HIGH (ref 0.0–40.0)

## 2021-04-01 LAB — LDL CHOLESTEROL, DIRECT: Direct LDL: 119 mg/dL

## 2021-04-01 LAB — TSH: TSH: 1.02 u[IU]/mL (ref 0.35–5.50)

## 2021-04-01 LAB — VITAMIN D 25 HYDROXY (VIT D DEFICIENCY, FRACTURES): VITD: 21.32 ng/mL — ABNORMAL LOW (ref 30.00–100.00)

## 2021-04-01 NOTE — Patient Instructions (Signed)
Good to see you again! ?Congrats on your weight loss so far! ?Keep on current regimen. Keep working on lifestyle goals. ? ?Fasting labs today - will call / MyChart with results. ? ? ?

## 2021-04-01 NOTE — Progress Notes (Signed)
? ?Subjective:  ? ? Patient ID: Misty Sanchez, female    DOB: 1980/05/19, 41 y.o.   MRN: [id number] ? ?Chief Complaint  ?Patient presents with  ? Weight Gain  ? ? ?HPI ?Patient is in today for f/up from 03/04/21. Started on Wellbutrin XL 150 mg to pair with Prozac 20 mg. Doing well and noticing improvement already. More focus and energy, less sugar cravings. Still doing well with workouts. Fasting today to check labs. No other concerns.  ? ?Past Medical History:  ?Diagnosis Date  ? ADHD (attention deficit hyperactivity disorder)   ? Anxiety   ? Frequent UTI   ? Hyperlipidemia   ? IC (interstitial cystitis)   ? PCOS (polycystic ovarian syndrome)   ? ? ?Past Surgical History:  ?Procedure Laterality Date  ? TYMPANOSTOMY TUBE PLACEMENT Bilateral 03/2013  ? WISDOM TOOTH EXTRACTION    ? ? ?Family History  ?Problem Relation Age of Onset  ? Hypertension Mother   ? Celiac disease Father   ? Diabetes Paternal Uncle   ? Drug abuse Paternal Uncle   ?     heroin  ? Cancer Maternal Grandmother   ?     gland behind the ear  ? ? ?Social History  ? ?Tobacco Use  ? Smoking status: Never  ? Smokeless tobacco: Never  ?Vaping Use  ? Vaping Use: Never used  ?Substance Use Topics  ? Alcohol use: No  ? Drug use: No  ?  ? ?Allergies  ?Allergen Reactions  ? Azithromycin   ?  Nausea ( z-pack )  ? ? ?Review of Systems ?NEGATIVE UNLESS OTHERWISE INDICATED IN HPI ? ? ?   ?Objective:  ?  ? ?BP 119/85   Pulse 83   Temp 98.6 ?F (37 ?C)   Ht 5\' 7"  (1.702 m)   Wt 194 lb 3.2 oz (88.1 kg)   SpO2 98%   BMI 30.42 kg/m?  ? ?Wt Readings from Last 3 Encounters:  ?04/01/21 194 lb 3.2 oz (88.1 kg)  ?03/04/21 197 lb 4 oz (89.5 kg)  ?12/31/20 191 lb 6.1 oz (86.8 kg)  ? ? ?BP Readings from Last 3 Encounters:  ?04/01/21 119/85  ?03/04/21 113/77  ?12/31/20 119/78  ?  ? ?Physical Exam ?Vitals and nursing note reviewed.  ?Constitutional:   ?   Appearance: Normal appearance. She is normal weight. She is not toxic-appearing.  ?HENT:  ?   Head: Normocephalic and  atraumatic.  ?   Right Ear: External ear normal.  ?   Left Ear: External ear normal.  ?Eyes:  ?   Extraocular Movements: Extraocular movements intact.  ?   Conjunctiva/sclera: Conjunctivae normal.  ?   Pupils: Pupils are equal, round, and reactive to light.  ?Neck:  ?   Thyroid: No thyroid mass, thyromegaly or thyroid tenderness.  ?Cardiovascular:  ?   Rate and Rhythm: Normal rate and regular rhythm.  ?   Pulses: Normal pulses.  ?   Heart sounds: Normal heart sounds.  ?Pulmonary:  ?   Effort: Pulmonary effort is normal.  ?   Breath sounds: Normal breath sounds.  ?Musculoskeletal:     ?   General: Normal range of motion.  ?   Cervical back: Normal range of motion and neck supple.  ?Lymphadenopathy:  ?   Cervical: No cervical adenopathy.  ?Skin: ?   General: Skin is warm and dry.  ?Neurological:  ?   General: No focal deficit present.  ?   Mental Status: She is alert and  oriented to person, place, and time.  ?Psychiatric:     ?   Mood and Affect: Mood normal.     ?   Behavior: Behavior normal.     ?   Thought Content: Thought content normal.     ?   Judgment: Judgment normal.  ? ? ?   ?Assessment & Plan:  ? ?Problem List Items Addressed This Visit   ? ?  ? Other  ? GAD (generalized anxiety disorder) - Primary  ? Relevant Orders  ? CBC with Differential/Platelet  ? Comprehensive metabolic panel  ? ?Other Visit Diagnoses   ? ? Attention deficit hyperactivity disorder (ADHD), combined type      ? Abnormal weight gain      ? Relevant Orders  ? CBC with Differential/Platelet  ? Comprehensive metabolic panel  ? Lipid panel  ? TSH  ? Vitamin D (25 hydroxy)  ? Mixed hyperlipidemia      ? Relevant Orders  ? CBC with Differential/Platelet  ? Comprehensive metabolic panel  ? Lipid panel  ? Vitamin D deficiency      ? Relevant Orders  ? Vitamin D (25 hydroxy)  ? ?  ? ? ? ?1. GAD (generalized anxiety disorder) ?2. Attention deficit hyperactivity disorder (ADHD), combined type ?3. Abnormal weight gain ?-Addition of Wellbutrin XL  150 mg to her Prozac 20 mg seems to be helping well. Will continue both of these medications at this time. ?-4 lb loss since last visit  ?-Recheck TSH and basic labs to make sure no underlying contributors. ? ?4. Mixed hyperlipidemia ?Lab Results  ?Component Value Date  ? CHOL 186 10/26/2019  ? HDL 32 (L) 10/26/2019  ? LDLCALC 114 (H) 10/26/2019  ? TRIG 300 (H) 10/26/2019  ? CHOLHDL 5.8 (H) 10/26/2019  ? ?-Recheck fasting lipid panel today, hoping to see good improvement in her results as she has been working so hard on diet and exercise. ? ?5. Vitamin D deficiency ?-She has had hx of Vit D def in the past, will recheck level today and treat accordingly. ? ?Recheck in 2 months or prn  ? ? ?Gretel Cantu M Christia Coaxum, PA-C ?

## 2021-04-08 ENCOUNTER — Ambulatory Visit: Payer: BC Managed Care – PPO | Admitting: Podiatry

## 2021-04-08 ENCOUNTER — Other Ambulatory Visit: Payer: Self-pay

## 2021-04-08 ENCOUNTER — Encounter: Payer: Self-pay | Admitting: Podiatry

## 2021-04-08 DIAGNOSIS — B351 Tinea unguium: Secondary | ICD-10-CM

## 2021-04-08 DIAGNOSIS — M722 Plantar fascial fibromatosis: Secondary | ICD-10-CM

## 2021-04-08 MED ORDER — TRIAMCINOLONE ACETONIDE 10 MG/ML IJ SUSP
20.0000 mg | Freq: Once | INTRAMUSCULAR | Status: AC
  Administered 2021-04-08: 20 mg

## 2021-04-08 NOTE — Progress Notes (Signed)
Subjective:  ? ?Patient ID: Misty Sanchez, female   DOB: 41 y.o.   MRN: [id number]  ? ?HPI ?Patient states her heels have started to hurt again and she had approximately 4 months of relief last time and is getting ready to go on vacation and is having trouble with significant ambulation with high arch structure ? ? ?ROS ? ? ?   ?Objective:  ?Physical Exam  ?Vascular status intact with exquisite discomfort in the plantar fascial insertion calcaneus bilateral with inflammation fluid buildup high arch foot structure noted ? ?   ?Assessment:  ?Chronic fasciitis with acute flareup of the heel region bilateral at insertion along with structural cavus deformity ? ?   ?Plan:  ?H&P reviewed condition sterile prep and injected the fascial insertion calcaneus bilateral 3 mg Kenalog 5 mg Xylocaine did dispensed night splint and ice packs to begin using at home and reappoint to recheck ?   ? ? ?

## 2021-06-04 ENCOUNTER — Encounter: Payer: Self-pay | Admitting: Physician Assistant

## 2021-06-04 ENCOUNTER — Telehealth: Payer: Self-pay

## 2021-06-04 ENCOUNTER — Ambulatory Visit: Payer: BC Managed Care – PPO | Admitting: Physician Assistant

## 2021-06-04 VITALS — BP 121/85 | HR 78 | Temp 97.7°F | Ht 67.0 in | Wt 193.4 lb

## 2021-06-04 DIAGNOSIS — E8881 Metabolic syndrome: Secondary | ICD-10-CM | POA: Diagnosis not present

## 2021-06-04 DIAGNOSIS — F902 Attention-deficit hyperactivity disorder, combined type: Secondary | ICD-10-CM

## 2021-06-04 DIAGNOSIS — F411 Generalized anxiety disorder: Secondary | ICD-10-CM

## 2021-06-04 DIAGNOSIS — R635 Abnormal weight gain: Secondary | ICD-10-CM

## 2021-06-04 MED ORDER — WEGOVY 0.25 MG/0.5ML ~~LOC~~ SOAJ: 0.2500 mg | SUBCUTANEOUS | 1 refills | Status: DC

## 2021-06-04 NOTE — Patient Instructions (Addendum)
Great to see you as always!  Keep up good work!  Continue on Prozac Stop Wellbutrin Try to get [facility] approved - if not, we can look at Phentermine for the 3 months over the summer, just let me know!

## 2021-06-04 NOTE — Telephone Encounter (Signed)
Misty Sanchez (Key: [id number]) Rx #: [id number] 0.25MG /0.5ML auto-injectors   Form [employer] Electronic PA Form [id number] NCPDP)  Waiting on determination

## 2021-06-04 NOTE — Progress Notes (Signed)
Subjective:    Patient ID: Misty Sanchez, female    DOB: [birth year], 41 y.o.   MRN: [id number]  Chief Complaint  Patient presents with   Follow-up    Pt states extra medication Wellbutrin is working but has continued to take it. Pt states it worked to begin with then just kind of tapered off. Pt doesn't currently have an OBGYN and needs to schedule a Pap Smear here in the office if possible.    HPI Patient is in today for f/up from 04/01/21. Says anxiety is doing ok, no worse than where it was. Starting to notice more cravings kicking in again. Failed Vyvanse and Metformin in the past. Saxenda - had bad side effects.  Still exercising daily. Trying to watch what she eats - portioned snacking at work. Frustrated by weight plateau.   Past Medical History:  Diagnosis Date   ADHD (attention deficit hyperactivity disorder)    Anxiety    Frequent UTI    Hyperlipidemia    IC (interstitial cystitis)    PCOS (polycystic ovarian syndrome)     Past Surgical History:  Procedure Laterality Date   TYMPANOSTOMY TUBE PLACEMENT Bilateral 03/2013   WISDOM TOOTH EXTRACTION      Family History  Problem Relation Age of Onset   Hypertension Mother    Celiac disease Father    Diabetes Paternal Uncle    Drug abuse Paternal Uncle        heroin   Cancer Maternal Grandmother        gland behind the ear    Social History   Tobacco Use   Smoking status: Never   Smokeless tobacco: Never  Vaping Use   Vaping Use: Never used  Substance Use Topics   Alcohol use: No   Drug use: No     Allergies  Allergen Reactions   Azithromycin     Nausea ( z-pack )    Review of Systems NEGATIVE UNLESS OTHERWISE INDICATED IN HPI      Objective:     BP 121/85 (BP Location: Left Arm)   Pulse 78   Temp 97.7 F (36.5 C) (Temporal)   Ht 5\' 7"  (1.702 m)   Wt 193 lb 6.4 oz (87.7 kg)   SpO2 97%   BMI 30.29 kg/m   Wt Readings from Last 3 Encounters:  06/04/21 193 lb 6.4 oz (87.7 kg)   04/01/21 194 lb 3.2 oz (88.1 kg)  03/04/21 197 lb 4 oz (89.5 kg)    BP Readings from Last 3 Encounters:  06/04/21 121/85  04/01/21 119/85  03/04/21 113/77     Physical Exam Vitals and nursing note reviewed.  Constitutional:      Appearance: Normal appearance. She is normal weight. She is not toxic-appearing.  HENT:     Head: Normocephalic and atraumatic.  Eyes:     Extraocular Movements: Extraocular movements intact.     Conjunctiva/sclera: Conjunctivae normal.     Pupils: Pupils are equal, round, and reactive to light.  Neck:     Thyroid: No thyroid mass, thyromegaly or thyroid tenderness.  Cardiovascular:     Rate and Rhythm: Normal rate and regular rhythm.     Pulses: Normal pulses.     Heart sounds: Normal heart sounds.  Pulmonary:     Effort: Pulmonary effort is normal.     Breath sounds: Normal breath sounds.  Musculoskeletal:     Cervical back: Normal range of motion and neck supple.  Lymphadenopathy:     Cervical:  No cervical adenopathy.  Skin:    General: Skin is warm and dry.  Neurological:     General: No focal deficit present.     Mental Status: She is alert and oriented to person, place, and time.  Psychiatric:        Mood and Affect: Mood normal.        Behavior: Behavior normal.        Thought Content: Thought content normal.        Judgment: Judgment normal.       Assessment & Plan:   Problem List Items Addressed This Visit       Endocrine   Insulin resistance     Other   Attention deficit hyperactivity disorder (ADHD), combined type   GAD (generalized anxiety disorder) - Primary   Other Visit Diagnoses     Abnormal weight gain            Meds ordered this encounter  Medications   Semaglutide-Weight Management (WEGOVY) 0.25 MG/0.5ML SOAJ    Sig: Inject 0.25 mg into the skin once a week for 28 days.    Dispense:  2 mL    Refill:  1    Order Specific Question:   Supervising Provider    Answer:   Shelva Majestic [[id number]]    PLAN: -Cont Prozac 20 mg -Stop Wellbutrin (no longer effective) -Pt wants to try once weekly glp-1, will send for [facility], possible SE discussed -If denied, can look at Phentermine this summer -Keep exercising and working with nutritionist   Return in about 6 months (around 12/05/2021) for recheck .  This note was prepared with assistance of Conservation officer, historic buildings. Occasional wrong-word or sound-a-like substitutions may have occurred due to the inherent limitations of voice recognition software.    Jami Ohlin M Anastasija Anfinson, PA-C

## 2021-06-05 NOTE — Telephone Encounter (Signed)
Misty Sanchez (Key: [id number]) Rx #: [id number] TLXBWI 0.25MG /0.5ML auto-injectors  Approved and contacted pharmacy to advise of approval

## 2021-07-02 ENCOUNTER — Telehealth: Payer: Self-pay | Admitting: Physician Assistant

## 2021-07-02 ENCOUNTER — Other Ambulatory Visit: Payer: Self-pay

## 2021-07-02 MED ORDER — WEGOVY 0.25 MG/0.5ML ~~LOC~~ SOAJ: 0.2500 mg | SUBCUTANEOUS | 0 refills | Status: DC

## 2021-07-02 NOTE — Telephone Encounter (Signed)
Pt states original pharmacy is out of stock. States pharmacy listed below has1 box available.   LAST APPOINTMENT DATE:   06/04/21  NEXT APPOINTMENT DATE: 12/02/21  MEDICATION: Semaglutide-Weight Management (WEGOVY) 0.25 MG/0.5ML SOAJ [[id number]]    Is the patient out of medication?  Next injection needed on 06/23   PHARMACY: [facility]  [facility] [address] [place], [place] [address] [phone]

## 2021-07-02 NOTE — Telephone Encounter (Signed)
Rx refill sent to pharmacy. 

## 2021-07-03 ENCOUNTER — Ambulatory Visit: Payer: BC Managed Care – PPO | Admitting: Podiatry

## 2021-07-03 ENCOUNTER — Encounter: Payer: Self-pay | Admitting: Podiatry

## 2021-07-03 ENCOUNTER — Ambulatory Visit (INDEPENDENT_AMBULATORY_CARE_PROVIDER_SITE_OTHER): Payer: BC Managed Care – PPO

## 2021-07-03 DIAGNOSIS — M722 Plantar fascial fibromatosis: Secondary | ICD-10-CM

## 2021-07-03 MED ORDER — PREDNISONE 10 MG PO TABS: ORAL_TABLET | ORAL | 0 refills | Status: DC

## 2021-07-03 MED ORDER — TRIAMCINOLONE ACETONIDE 10 MG/ML IJ SUSP
20.0000 mg | Freq: Once | INTRAMUSCULAR | Status: AC
  Administered 2021-07-03: 20 mg

## 2021-07-03 NOTE — Progress Notes (Signed)
Subjective:   Patient ID: Misty Sanchez, female   DOB: 41 y.o.   MRN: [id number]   HPI Patient presents stating she has had a severe increase in pain in her heel left over right and that the left one was awful when she got out of bed on Sunday.  Patient states that she is interested in shockwave therapy but right now she has trouble walking on her heels    ROS      Objective:  Physical Exam  Neurovascular status intact muscle strength adequate severe discomfort medial fascial band left over right with inflammation fluid in the medial band at the insertional point of the fascia into the calcaneus     Assessment:  2 plantar fasciitis left over right with inflammation fluid of a significant range with inability to bear weight on the plantar heel     Plan:  H&P reviewed the condition sterile prep injected the plantar fascia bilateral at insertion 3 mg Kenalog 5 mm Xylocaine applied air fracture walker left to immobilize and take the pressure off the tendon placed on anti-inflammatory and did do Sterapred DS 12-day Dosepak today reappoint 2 weeks and discussed shockwave therapy for the long-term  X-rays do indicate spur do not indication signs of stress fracture or other pathology

## 2021-07-22 ENCOUNTER — Encounter: Payer: Self-pay | Admitting: Podiatry

## 2021-07-22 ENCOUNTER — Ambulatory Visit: Payer: BC Managed Care – PPO | Admitting: Podiatry

## 2021-07-22 DIAGNOSIS — M722 Plantar fascial fibromatosis: Secondary | ICD-10-CM | POA: Diagnosis not present

## 2021-07-22 NOTE — Progress Notes (Signed)
Subjective:   Patient ID: Misty Sanchez, female   DOB: 41 y.o.   MRN: [id number]   HPI Patient presents stating she has had some improvement but she needs to start shockwave at this time to try to keep the symptoms from getting worse   ROS      Objective:  Physical Exam  Neurovascular status intact with patient still having quite a bit of discomfort plantar left heel but improvement with immobilization previous injection treatment     Assessment:  Chronic Planter fasciitis left still present     Plan:  Reviewed condition and recommended shockwave therapy and discussed and explained this to her.  She wants to have this done understanding the risk of this and I did explain all this to him patient is scheduled for shockwave therapy to begin in the next couple weeks and will use her boot as best as possible

## 2021-07-31 ENCOUNTER — Other Ambulatory Visit: Payer: Self-pay | Admitting: Physician Assistant

## 2021-08-01 ENCOUNTER — Telehealth: Payer: Self-pay | Admitting: Physician Assistant

## 2021-08-01 NOTE — Telephone Encounter (Signed)
Ok to increase dose? Only available in 1.0 and 1.7mg ; please advise

## 2021-08-01 NOTE — Telephone Encounter (Signed)
Pt states pharmacy is on back order of current does of her medication but states they have other doses available. They asked if we could call them and possibly change the dose. Please advise.  MEDICATION:WEGOVY 0.25 MG/0.5ML [facility]  PHARMACY: [facility] [id number] [place], [place] - [address] Phone:  [phone]  Fax:  [phone]

## 2021-08-02 NOTE — Telephone Encounter (Signed)
Called pt and verified DOB; advised pt recommendations and to keep checking with pharmacy. Pt understood and verbalized understanding

## 2021-09-06 ENCOUNTER — Other Ambulatory Visit: Payer: BC Managed Care – PPO

## 2021-09-13 ENCOUNTER — Other Ambulatory Visit: Payer: BC Managed Care – PPO

## 2021-09-20 ENCOUNTER — Other Ambulatory Visit: Payer: BC Managed Care – PPO

## 2021-09-23 DIAGNOSIS — M722 Plantar fascial fibromatosis: Secondary | ICD-10-CM | POA: Insufficient documentation

## 2021-09-25 ENCOUNTER — Ambulatory Visit: Payer: BC Managed Care – PPO | Admitting: Physician Assistant

## 2021-09-25 ENCOUNTER — Encounter: Payer: Self-pay | Admitting: Physician Assistant

## 2021-09-25 VITALS — BP 118/77 | HR 76 | Temp 98.3°F | Ht 67.0 in | Wt 193.6 lb

## 2021-09-25 DIAGNOSIS — G7249 Other inflammatory and immune myopathies, not elsewhere classified: Secondary | ICD-10-CM

## 2021-09-25 DIAGNOSIS — M722 Plantar fascial fibromatosis: Secondary | ICD-10-CM | POA: Diagnosis not present

## 2021-09-25 NOTE — Progress Notes (Signed)
Subjective:    Patient ID: Misty Sanchez, female    DOB: 08-07-1980, 41 y.o.   MRN: [id number]  Chief Complaint  Patient presents with   Follow-up    Pt is requesting lab work to check for autoimmune deficiencies; pt is being seen at [facility] due to not healing of  plantar fasciitis; pt needs referral to GYN overdue for Pap, pt declined flu shot today and will do later in the fall.Pt brought in labss requested from [facility] to be drawn     HPI Patient is in today for lab check - needs to be sent to [facility].   Plantar fasciitis terrible for years - braces, shoes, boots, cortisone injections, nothing is helping. Pain constantly. [facility] concerned about autoimmune etiology b/c nothing is helping. Here to go through labs recommended to her and to decline some if possible due to financial reasons.  She denies any other bilateral pain issues. No major fatigue or other symptoms.   Past Medical History:  Diagnosis Date   ADHD (attention deficit hyperactivity disorder)    Anxiety    Frequent UTI    Hyperlipidemia    IC (interstitial cystitis)    PCOS (polycystic ovarian syndrome)     Past Surgical History:  Procedure Laterality Date   TYMPANOSTOMY TUBE PLACEMENT Bilateral 03/2013   WISDOM TOOTH EXTRACTION      Family History  Problem Relation Age of Onset   Hypertension Mother    Celiac disease Father    Diabetes Paternal Uncle    Drug abuse Paternal Uncle        heroin   Cancer Maternal Grandmother        gland behind the ear    Social History   Tobacco Use   Smoking status: Never   Smokeless tobacco: Never  Vaping Use   Vaping Use: Never used  Substance Use Topics   Alcohol use: No   Drug use: No     Allergies  Allergen Reactions   Azithromycin     Nausea ( z-pack )    Review of Systems NEGATIVE UNLESS OTHERWISE INDICATED IN HPI      Objective:     BP 118/77 (BP Location: Left Arm)   Pulse 76   Temp 98.3 F (36.8 C) (Temporal)   Ht 5\' 7"   (1.702 m)   Wt 193 lb 9.6 oz (87.8 kg)   SpO2 98%   BMI 30.32 kg/m   Wt Readings from Last 3 Encounters:  09/25/21 193 lb 9.6 oz (87.8 kg)  06/04/21 193 lb 6.4 oz (87.7 kg)  04/01/21 194 lb 3.2 oz (88.1 kg)    BP Readings from Last 3 Encounters:  09/25/21 118/77  06/04/21 121/85  04/01/21 119/85     Physical Exam Vitals and nursing note reviewed.  Constitutional:      Appearance: Normal appearance. She is normal weight. She is not toxic-appearing.  HENT:     Head: Normocephalic and atraumatic.  Eyes:     Extraocular Movements: Extraocular movements intact.     Conjunctiva/sclera: Conjunctivae normal.     Pupils: Pupils are equal, round, and reactive to light.  Neck:     Thyroid: No thyroid mass, thyromegaly or thyroid tenderness.  Cardiovascular:     Rate and Rhythm: Normal rate and regular rhythm.     Pulses: Normal pulses.     Heart sounds: Normal heart sounds.  Pulmonary:     Effort: Pulmonary effort is normal.     Breath sounds: Normal breath  sounds.  Musculoskeletal:     Cervical back: Normal range of motion and neck supple.  Lymphadenopathy:     Cervical: No cervical adenopathy.  Skin:    General: Skin is warm and dry.  Neurological:     General: No focal deficit present.     Mental Status: She is alert and oriented to person, place, and time.     Motor: No weakness.     Gait: Gait normal.  Psychiatric:        Mood and Affect: Mood normal.        Behavior: Behavior normal.        Assessment & Plan:  Bilateral plantar fasciitis -     ANA, IFA Comprehensive Panel -     Rheumatoid factor -     Sedimentation rate -     C-reactive protein  Other inflammatory and immune myopathies, not elsewhere classified -     ANA, IFA Comprehensive Panel -     Rheumatoid factor -     Sedimentation rate -     C-reactive protein    I went through concerns with patient about financial restraint given large blood work that was recommended to her by orthopedics.  We  narrowed down her list to include ANA, IFA, rheumatoid factor, sed rate, CRP.  If any of those are elevated, she would like to proceed with further work-up from there.  She will continue to work with Raechel Chute on ways to treat her Planter fasciitis without going through surgery, although she understands this might end up being what happens.  Tried to encourage patient to keep up the good work with everything that she has been doing.  Available for recheck if she needs any point.   This note was prepared with assistance of Conservation officer, historic buildings. Occasional wrong-word or sound-a-like substitutions may have occurred due to the inherent limitations of voice recognition software.  Time Spent: 23 minutes of total time was spent on the date of the encounter performing the following actions: chart review prior to seeing the patient, obtaining history, performing a medically necessary exam, counseling on the treatment plan, placing orders, and documenting in our EHR.       Candi Profit M Marjon Doxtater, PA-C

## 2021-09-26 LAB — C-REACTIVE PROTEIN: CRP: 1 mg/L (ref <8.0)

## 2021-09-26 LAB — ANA, IFA COMPREHENSIVE PANEL
Anti Nuclear Antibody (ANA): NEGATIVE
ENA SM Ab Ser-aCnc: <1 AI
SM/RNP: <1 AI
SSA (Ro) (ENA) Antibody, IgG: <1 AI
SSB (La) (ENA) Antibody, IgG: <1 AI
Scleroderma (Scl-70) (ENA) Antibody, IgG: <1 AI
ds DNA Ab: 1 IU/mL

## 2021-09-26 LAB — RHEUMATOID FACTOR: Rheumatoid fact SerPl-aCnc: <14 IU/mL (ref <14)

## 2021-09-26 LAB — SEDIMENTATION RATE: Sed Rate: 6 mm/h (ref 0–20)

## 2021-11-04 DIAGNOSIS — M25572 Pain in left ankle and joints of left foot: Secondary | ICD-10-CM | POA: Insufficient documentation

## 2021-11-04 DIAGNOSIS — M6702 Short Achilles tendon (acquired), left ankle: Secondary | ICD-10-CM | POA: Insufficient documentation

## 2021-11-21 DIAGNOSIS — M79672 Pain in left foot: Secondary | ICD-10-CM | POA: Insufficient documentation

## 2021-11-21 DIAGNOSIS — M722 Plantar fascial fibromatosis: Secondary | ICD-10-CM | POA: Insufficient documentation

## 2021-11-21 HISTORY — DX: Pain in left foot: M79.672

## 2021-11-23 DIAGNOSIS — M79671 Pain in right foot: Secondary | ICD-10-CM | POA: Insufficient documentation

## 2021-11-23 HISTORY — DX: Pain in right foot: M79.671

## 2021-12-02 ENCOUNTER — Ambulatory Visit: Payer: BC Managed Care – PPO | Admitting: Physician Assistant

## 2021-12-17 ENCOUNTER — Other Ambulatory Visit: Payer: Self-pay | Admitting: Physician Assistant

## 2021-12-31 ENCOUNTER — Encounter: Payer: BC Managed Care – PPO | Admitting: Physician Assistant

## 2022-03-19 ENCOUNTER — Other Ambulatory Visit: Payer: Self-pay | Admitting: Physician Assistant

## 2022-06-17 ENCOUNTER — Other Ambulatory Visit: Payer: Self-pay | Admitting: Physician Assistant

## 2022-07-29 ENCOUNTER — Encounter: Payer: Self-pay | Admitting: Physician Assistant

## 2022-07-29 ENCOUNTER — Ambulatory Visit: Payer: BC Managed Care – PPO | Admitting: Physician Assistant

## 2022-07-29 VITALS — BP 99/65 | HR 74 | Ht 67.0 in | Wt 200.2 lb

## 2022-07-29 DIAGNOSIS — G44219 Episodic tension-type headache, not intractable: Secondary | ICD-10-CM

## 2022-07-29 DIAGNOSIS — Z975 Presence of (intrauterine) contraceptive device: Secondary | ICD-10-CM | POA: Diagnosis not present

## 2022-07-29 DIAGNOSIS — E282 Polycystic ovarian syndrome: Secondary | ICD-10-CM | POA: Diagnosis not present

## 2022-07-29 MED ORDER — CYCLOBENZAPRINE HCL 10 MG PO TABS
ORAL_TABLET | ORAL | 0 refills | Status: AC
Start: 2022-07-29 — End: ?

## 2022-07-29 MED ORDER — NAPROXEN 500 MG PO TABS: ORAL_TABLET | ORAL | 0 refills | Status: AC

## 2022-07-29 NOTE — Assessment & Plan Note (Signed)
No red flags on exam I think her headaches may be coming from her neck pain / spasms Intermittent, will trial naproxen and flexeril as directed, heat or ice to area, she also does see a massage therapist. Try to limit NSAID use to reduce rebound HA risk.

## 2022-07-29 NOTE — Progress Notes (Signed)
Subjective:    Patient ID: Misty Sanchez, female    DOB: 13-Nov-1980, 42 y.o.   MRN: [id number]  Chief Complaint  Patient presents with   Medical Management of Chronic Issues    Consistant headaches, has been a problem for a year. Normally starts on Sundays, [facility] thought it was best to come see PCP and to take ibuprofen instead of Tylenol. Has had headache now for 3-4 weeks    HPI Patient is in today for headaches. The last year, weekly, starts Sunday night, then will go through the next few days. Current HA going on 3-4 weeks.  Two weeks ago went to [facility], switched to ibuprofen from Tylenol. Frontal, R sided pounding type pain, sometimes to temple and base of skull, pressure pain, only slight pain on the left.  No eye drainage or congestion.  No nausea or vomiting. No floaters. Sometimes the lights bother her.    Past Medical History:  Diagnosis Date   ADHD (attention deficit hyperactivity disorder)    Anxiety    Frequent UTI    Hyperlipidemia    IC (interstitial cystitis)    PCOS (polycystic ovarian syndrome)     Past Surgical History:  Procedure Laterality Date   TYMPANOSTOMY TUBE PLACEMENT Bilateral 03/2013   WISDOM TOOTH EXTRACTION      Family History  Problem Relation Age of Onset   Hypertension Mother    Celiac disease Father    Diabetes Paternal Uncle    Drug abuse Paternal Uncle        heroin   Cancer Maternal Grandmother        gland behind the ear    Social History   Tobacco Use   Smoking status: Never   Smokeless tobacco: Never  Vaping Use   Vaping status: Never Used  Substance Use Topics   Alcohol use: No   Drug use: No     Allergies  Allergen Reactions   Azithromycin     Nausea ( z-pack )    Review of Systems NEGATIVE UNLESS OTHERWISE INDICATED IN HPI      Objective:     BP 99/65 (BP Location: Left Arm, Patient Position: Sitting, Cuff Size: Large)   Pulse 74   Ht 5\' 7"  (1.702 m)   Wt 200 lb 3.2 oz (90.8 kg)    SpO2 98%   BMI 31.36 kg/m   Wt Readings from Last 3 Encounters:  07/29/22 200 lb 3.2 oz (90.8 kg)  09/25/21 193 lb 9.6 oz (87.8 kg)  06/04/21 193 lb 6.4 oz (87.7 kg)    BP Readings from Last 3 Encounters:  07/29/22 99/65  09/25/21 118/77  06/04/21 121/85     Physical Exam Vitals and nursing note reviewed.  Constitutional:      Appearance: Normal appearance.  Cardiovascular:     Rate and Rhythm: Normal rate and regular rhythm.     Pulses: Normal pulses.     Heart sounds: No murmur heard. Pulmonary:     Effort: Pulmonary effort is normal.     Breath sounds: Normal breath sounds.  Musculoskeletal:        General: Tenderness (R paracervical muscles TTP and tight) present.  Neurological:     General: No focal deficit present.     Mental Status: She is alert and oriented to person, place, and time.     Cranial Nerves: No cranial nerve deficit.  Psychiatric:        Mood and Affect: Mood normal.  Behavior: Behavior normal.        Assessment & Plan:  Frequent episodic tension-type headache Assessment & Plan: No red flags on exam I think her headaches may be coming from her neck pain / spasms Intermittent, will trial naproxen and flexeril as directed, heat or ice to area, she also does see a massage therapist. Try to limit NSAID use to reduce rebound HA risk.  Orders: -     Naproxen; Take 1 tab po up to twice daily as needed for moderate pain or headache.  Dispense: 30 tablet; Refill: 0 -     Cyclobenzaprine HCl; Take one tab po for muscle spasm and headache as needed at night.  Dispense: 30 tablet; Refill: 0  PCOS (polycystic ovarian syndrome) -     Ambulatory referral to Obstetrics / Gynecology  Presence of IUD -     Ambulatory referral to Obstetrics / Gynecology        Return in about 2 months (around 09/29/2022) for recheck/follow-up.   Misty Leiphart M Emilia Kayes, PA-C

## 2022-08-18 ENCOUNTER — Telehealth: Payer: Self-pay | Admitting: Physician Assistant

## 2022-08-18 NOTE — Telephone Encounter (Signed)
Patient states samples for the following medication worked well and requests RX of the following:  Prescription Request  08/18/2022  LOV: 07/29/2022  What is the name of the medication or equipment? Wegovy 0.25  Have you contacted your pharmacy to request a refill? Yes   Which pharmacy would you like this sent to?   [facility] [id number] [place], [place] - [address] [facility] [address] [place] [address] Phone: [phone] Fax: [phone]    Patient notified that their request is being sent to the clinical staff for review and that they should receive a response within 2 business days.   Please advise at Mobile [phone] (mobile)

## 2022-08-20 NOTE — Telephone Encounter (Signed)
Pease advise the bottom message

## 2022-08-21 ENCOUNTER — Other Ambulatory Visit: Payer: Self-pay | Admitting: Physician Assistant

## 2022-08-21 ENCOUNTER — Other Ambulatory Visit: Payer: Self-pay | Admitting: *Deleted

## 2022-08-21 MED ORDER — WEGOVY 0.5 MG/0.5ML ~~LOC~~ SOAJ: 0.5000 mg | SUBCUTANEOUS | 0 refills | Status: DC

## 2022-08-21 NOTE — Telephone Encounter (Signed)
Rx send to [facility]  

## 2022-08-21 NOTE — Telephone Encounter (Signed)
Patient states medication was sent to [facility] pharmacy but was meant to be sent to [facility] on [address]. Patient requests that this be corrected.

## 2022-08-25 ENCOUNTER — Other Ambulatory Visit: Payer: Self-pay | Admitting: Physician Assistant

## 2022-08-25 ENCOUNTER — Telehealth: Payer: Self-pay | Admitting: Physician Assistant

## 2022-08-25 MED ORDER — TIRZEPATIDE 2.5 MG/0.5ML ~~LOC~~ SOAJ: 2.5000 mg | SUBCUTANEOUS | 0 refills | Status: DC

## 2022-08-25 NOTE — Telephone Encounter (Signed)
Patient requests RX for [phone] (not covered by insurance) be cancelled and a new RX for [facility] be sent to: [facility] [id number] [place], [place] - [address] Phone: [phone]  Fax: [phone]

## 2022-08-25 NOTE — Telephone Encounter (Signed)
Patient states pharmacy informed her that insurance doesn't cover wegovy. States she spoke with insurance who confirmed this and state all the other GLP-1s are covered. States she would like to trial the mounjaro since this was recommended to her by the pharmacist.

## 2022-08-25 NOTE — Telephone Encounter (Signed)
Pt is asking if she can try [facility] the pharmacist recommended this . States insurance will not cover the [employer] but states her insurance covers all others  GLP -1

## 2022-08-28 ENCOUNTER — Encounter (INDEPENDENT_AMBULATORY_CARE_PROVIDER_SITE_OTHER): Payer: Self-pay

## 2022-08-28 ENCOUNTER — Other Ambulatory Visit: Payer: Self-pay

## 2022-08-28 MED ORDER — TIRZEPATIDE 2.5 MG/0.5ML ~~LOC~~ SOAJ: 2.5000 mg | SUBCUTANEOUS | 0 refills | Status: DC

## 2022-08-28 NOTE — Telephone Encounter (Signed)
Sent pt Rx to correct pharmacy and advised patient she may need to update pharmacy preferences in MyChart to avoid this happening in the future. Pt asked if it was accepted by insurance and advised it was just sent to [facility] and they should be able to tell you if requiring PA, pt advised she can also reach out to her insurance company as well. Pt verbalized understanding

## 2022-08-28 NOTE — Telephone Encounter (Signed)
Patient called for an update on med. Medication was supposed to be sent to [facility] pharmacy on [address]. Can this be fixed please? Pt runs out of medication today.

## 2022-08-31 ENCOUNTER — Encounter: Payer: Self-pay | Admitting: Physician Assistant

## 2022-09-04 ENCOUNTER — Telehealth: Payer: BC Managed Care – PPO | Admitting: Physician Assistant

## 2022-09-05 ENCOUNTER — Other Ambulatory Visit: Payer: Self-pay

## 2022-09-09 ENCOUNTER — Telehealth: Payer: BC Managed Care – PPO | Admitting: Physician Assistant

## 2022-09-10 ENCOUNTER — Telehealth: Payer: BC Managed Care – PPO | Admitting: Physician Assistant

## 2022-09-10 DIAGNOSIS — R635 Abnormal weight gain: Secondary | ICD-10-CM | POA: Diagnosis not present

## 2022-09-10 DIAGNOSIS — E88819 Insulin resistance, unspecified: Secondary | ICD-10-CM

## 2022-09-10 DIAGNOSIS — F411 Generalized anxiety disorder: Secondary | ICD-10-CM | POA: Diagnosis not present

## 2022-09-10 MED ORDER — FLUOXETINE HCL 20 MG PO CAPS
20.0000 mg | ORAL_CAPSULE | Freq: Every day | ORAL | 3 refills | Status: DC
Start: 2022-09-10 — End: 2023-09-04

## 2022-09-10 MED ORDER — NALTREXONE-BUPROPION HCL ER 8-90 MG PO TB12: ORAL_TABLET | ORAL | 0 refills | Status: DC

## 2022-09-10 NOTE — Assessment & Plan Note (Signed)
Patient is consistent in good nutrition and exercise habits daily. Would benefit from pharmacotherapy intervention. Insurance would not cover [phone] (felt good on it, lost 6 lbs for a month). No coverage on Mounjaro either. Will attempt to start on Contrave pending insurance coverage. Pt aware of risks vs benefits and possible adverse reactions. She'll f/up in October if able to start on this medication.

## 2022-09-10 NOTE — Progress Notes (Signed)
   Virtual Visit via Video Note  I connected with  Misty Sanchez  on 09/10/22 at 11:15 AM EDT by a video enabled telemedicine application and verified that I am speaking with the correct person using two identifiers.  Location: Patient: home Provider: Nature conservation officer at [employer] Persons present: Patient and myself   I discussed the limitations of evaluation and management by telemedicine and the availability of in person appointments. The patient expressed understanding and agreed to proceed.   History of Present Illness:  42 yo female presenting for VV to discuss anxiety and weight concerns. See A/P.  Observations/Objective:   Gen: Awake, alert, no acute distress Resp: Breathing is even and non-labored Psych: calm/pleasant demeanor Neuro: Alert and Oriented x 3, + facial symmetry, speech is clear.   Assessment and Plan:  Problem List Items Addressed This Visit       Endocrine   Insulin resistance - Primary    Patient is consistent in good nutrition and exercise habits daily. Would benefit from pharmacotherapy intervention. Insurance would not cover [phone] (felt good on it, lost 6 lbs for a month). No coverage on Mounjaro either. Will attempt to start on Contrave pending insurance coverage. Pt aware of risks vs benefits and possible adverse reactions. She'll f/up in October if able to start on this medication.      Relevant Medications   Naltrexone-buPROPion HCl ER 8-90 MG TB12     Other   GAD (generalized anxiety disorder)    Chronic, stable Refilled Prozac 20 mg every day       Relevant Medications   FLUoxetine (PROZAC) 20 MG capsule   Abnormal weight gain   Relevant Medications   Naltrexone-buPROPion HCl ER 8-90 MG TB12     Follow Up Instructions:    I discussed the assessment and treatment plan with the patient. The patient was provided an opportunity to ask questions and all were answered. The patient agreed with the plan and demonstrated  an understanding of the instructions.   The patient was advised to call back or seek an in-person evaluation if the symptoms worsen or if the condition fails to improve as anticipated.  Devany Aja M Eulah Walkup, PA-C

## 2022-09-10 NOTE — Assessment & Plan Note (Signed)
Chronic, stable Refilled Prozac 20 mg every day

## 2022-09-29 ENCOUNTER — Ambulatory Visit: Payer: BC Managed Care – PPO | Admitting: Physician Assistant

## 2022-10-16 ENCOUNTER — Other Ambulatory Visit (HOSPITAL_COMMUNITY): Payer: Self-pay

## 2022-10-20 ENCOUNTER — Ambulatory Visit: Payer: BC Managed Care – PPO | Admitting: Physician Assistant

## 2022-10-20 VITALS — BP 116/80 | HR 86 | Temp 97.5°F | Ht 67.0 in | Wt 198.5 lb

## 2022-10-20 DIAGNOSIS — Z131 Encounter for screening for diabetes mellitus: Secondary | ICD-10-CM | POA: Diagnosis not present

## 2022-10-20 DIAGNOSIS — E88819 Insulin resistance, unspecified: Secondary | ICD-10-CM

## 2022-10-20 DIAGNOSIS — E782 Mixed hyperlipidemia: Secondary | ICD-10-CM

## 2022-10-20 MED ORDER — PHENTERMINE HCL 37.5 MG PO TABS: 37.5000 mg | ORAL_TABLET | Freq: Every day | ORAL | 0 refills | Status: DC

## 2022-10-20 NOTE — Progress Notes (Signed)
Subjective:    Patient ID: Misty Sanchez, female    DOB: [birth year], 42 y.o.   MRN: [id number]  Chief Complaint  Patient presents with   Follow-up    Pt in the office for 2 mon f/u; pt states doesn't feel like medication is working for her, not suppressing appetite at all, and not feeling any difference.    HPI Discussed the use of AI scribe software for clinical note transcription with the patient, who gave verbal consent to proceed.  History of Present Illness   Misty Sanchez, a patient with a history of insulin resistance and a family history of diabetes, presents for a follow-up visit regarding her weight management. She reports no success with the weight loss medication Contrave, which was started approximately six weeks ago. Despite increasing the dose as per the starting pack instructions, she has not noticed any suppression of her appetite or any weight loss. She has since reduced the dose to two pills a day to extend the duration of the medication, but still reports no noticeable effects.  In addition to her weight management concerns, Misty Sanchez also discusses her sleep habits. She acknowledges that she often stays up later than she should, which she attributes to distractions and her work on [employer]. She does not consume caffeine apart from occasional sweet tea, and she exercises regularly, three to four times a week.         Past Medical History:  Diagnosis Date   ADHD (attention deficit hyperactivity disorder)    Anxiety    Frequent UTI    Hyperlipidemia    IC (interstitial cystitis)    PCOS (polycystic ovarian syndrome)     Past Surgical History:  Procedure Laterality Date   TYMPANOSTOMY TUBE PLACEMENT Bilateral 03/2013   WISDOM TOOTH EXTRACTION      Family History  Problem Relation Age of Onset   Hypertension Mother    Celiac disease Father    Diabetes Paternal Uncle    Drug abuse Paternal Uncle        heroin   Cancer Maternal Grandmother        gland behind  the ear    Social History   Tobacco Use   Smoking status: Never   Smokeless tobacco: Never  Vaping Use   Vaping status: Never Used  Substance Use Topics   Alcohol use: No   Drug use: No     Allergies  Allergen Reactions   Azithromycin     Nausea ( z-pack )    Review of Systems NEGATIVE UNLESS OTHERWISE INDICATED IN HPI      Objective:     BP 116/80 (BP Location: Left Arm)   Pulse 86   Temp (!) 97.5 F (36.4 C) (Temporal)   Ht 5\' 7"  (1.702 m)   Wt 198 lb 8 oz (90 kg)   SpO2 97%   BMI 31.09 kg/m   Wt Readings from Last 3 Encounters:  10/20/22 198 lb 8 oz (90 kg)  07/29/22 200 lb 3.2 oz (90.8 kg)  09/25/21 193 lb 9.6 oz (87.8 kg)    BP Readings from Last 3 Encounters:  10/20/22 116/80  07/29/22 99/65  09/25/21 118/77     Physical Exam Vitals and nursing note reviewed.  Constitutional:      Appearance: Normal appearance. She is obese.  Cardiovascular:     Rate and Rhythm: Normal rate and regular rhythm.     Pulses: Normal pulses.  Pulmonary:     Effort: Pulmonary effort  is normal.     Breath sounds: Normal breath sounds.  Neurological:     General: No focal deficit present.     Mental Status: She is alert.  Psychiatric:        Mood and Affect: Mood normal.        Assessment & Plan:  Insulin resistance -     Lipid panel; Future -     Comprehensive metabolic panel; Future -     CBC with Differential/Platelet; Future -     Hemoglobin A1c; Future -     TSH; Future -     Phentermine HCl; Take 1 tablet (37.5 mg total) by mouth daily before breakfast.  Dispense: 90 tablet; Refill: 0  Diabetes mellitus screening -     Comprehensive metabolic panel; Future -     Hemoglobin A1c; Future  Mixed hyperlipidemia -     Lipid panel; Future       Obesity Ineffective response to Contrave. Patient reports no appetite suppression or weight loss. Previous trials of Saxenda were effective but discontinued due to gastrointestinal side effects. Insurance  does not cover [employer]. Patient has a history of ADHD and has previously taken Vyvanse. -Discontinue Contrave due to lack of efficacy. -Start Phentermine 37.5mg  daily before breakfast for 3 months. -Consider re-evaluation of insurance coverage for [facility] in the new year.     Pt aware of risks vs benefits and possible adverse reactions.     Return in about 3 months (around 01/20/2023) for recheck/follow-up.   Misty Sanchez M Kenidee Cregan, PA-C

## 2022-10-20 NOTE — Patient Instructions (Signed)
Need a lab appt and 3-4 mo f/up appt

## 2022-10-21 ENCOUNTER — Encounter: Payer: Self-pay | Admitting: Physician Assistant

## 2022-10-21 NOTE — Telephone Encounter (Signed)
Pt FYI.

## 2022-10-23 ENCOUNTER — Other Ambulatory Visit: Payer: BC Managed Care – PPO

## 2022-10-23 DIAGNOSIS — Z131 Encounter for screening for diabetes mellitus: Secondary | ICD-10-CM

## 2022-10-23 DIAGNOSIS — E782 Mixed hyperlipidemia: Secondary | ICD-10-CM | POA: Diagnosis not present

## 2022-10-23 DIAGNOSIS — E88819 Insulin resistance, unspecified: Secondary | ICD-10-CM | POA: Diagnosis not present

## 2022-10-23 LAB — COMPREHENSIVE METABOLIC PANEL
ALT: 20 U/L (ref 0–35)
AST: 14 U/L (ref 0–37)
Albumin: 4.3 g/dL (ref 3.5–5.2)
Alkaline Phosphatase: 65 U/L (ref 39–117)
BUN: 18 mg/dL (ref 6–23)
CO2: 25 meq/L (ref 19–32)
Calcium: 9.2 mg/dL (ref 8.4–10.5)
Chloride: 106 meq/L (ref 96–112)
Creatinine, Ser: 0.86 mg/dL (ref 0.40–1.20)
GFR: 84.72 mL/min (ref >60.00)
Glucose, Bld: 92 mg/dL (ref 70–99)
Potassium: 3.9 meq/L (ref 3.5–5.1)
Sodium: 138 meq/L (ref 135–145)
Total Bilirubin: 0.7 mg/dL (ref 0.2–1.2)
Total Protein: 6.4 g/dL (ref 6.0–8.3)

## 2022-10-23 LAB — HEMOGLOBIN A1C: Hgb A1c MFr Bld: 5.4 % (ref 4.6–6.5)

## 2022-10-23 LAB — CBC WITH DIFFERENTIAL/PLATELET
Basophils Absolute: 0.1 10*3/uL (ref 0.0–0.1)
Basophils Relative: 0.8 % (ref 0.0–3.0)
Eosinophils Absolute: 0.3 10*3/uL (ref 0.0–0.7)
Eosinophils Relative: 3.1 % (ref 0.0–5.0)
HCT: 38.5 % (ref 36.0–46.0)
Hemoglobin: 13.3 g/dL (ref 12.0–15.0)
Lymphocytes Relative: 22 % (ref 12.0–46.0)
Lymphs Abs: 1.9 10*3/uL (ref 0.7–4.0)
MCHC: 34.5 g/dL (ref 30.0–36.0)
MCV: 86.6 fL (ref 78.0–100.0)
Monocytes Absolute: 0.5 10*3/uL (ref 0.1–1.0)
Monocytes Relative: 5.5 % (ref 3.0–12.0)
Neutro Abs: 5.9 10*3/uL (ref 1.4–7.7)
Neutrophils Relative %: 68.6 % (ref 43.0–77.0)
Platelets: 359 10*3/uL (ref 150.0–400.0)
RBC: 4.44 Mil/uL (ref 3.87–5.11)
RDW: 12.7 % (ref 11.5–15.5)
WBC: 8.7 10*3/uL (ref 4.0–10.5)

## 2022-10-23 LAB — TSH: TSH: 1.05 u[IU]/mL (ref 0.35–5.50)

## 2022-10-23 LAB — LIPID PANEL
Cholesterol: 224 mg/dL — ABNORMAL HIGH (ref 0–200)
HDL: 32.3 mg/dL — ABNORMAL LOW (ref >39.00)
LDL Cholesterol: 166 mg/dL — ABNORMAL HIGH (ref 0–99)
NonHDL: 191.82
Total CHOL/HDL Ratio: 7
Triglycerides: 128 mg/dL (ref 0.0–149.0)
VLDL: 25.6 mg/dL (ref 0.0–40.0)

## 2022-12-23 ENCOUNTER — Encounter: Payer: Self-pay | Admitting: Physician Assistant

## 2022-12-23 NOTE — Telephone Encounter (Signed)
Please see patient message regarding weight loss medication and dosage and advise.

## 2023-01-20 ENCOUNTER — Encounter: Payer: Self-pay | Admitting: Physician Assistant

## 2023-02-02 ENCOUNTER — Ambulatory Visit: Payer: 59 | Admitting: Physician Assistant

## 2023-02-02 ENCOUNTER — Encounter: Payer: Self-pay | Admitting: Physician Assistant

## 2023-02-02 ENCOUNTER — Ambulatory Visit (INDEPENDENT_AMBULATORY_CARE_PROVIDER_SITE_OTHER): Payer: 59 | Admitting: Physician Assistant

## 2023-02-02 VITALS — BP 128/86 | HR 104 | Temp 97.2°F | Ht 67.0 in | Wt 193.0 lb

## 2023-02-02 DIAGNOSIS — E88819 Insulin resistance, unspecified: Secondary | ICD-10-CM

## 2023-02-02 DIAGNOSIS — E282 Polycystic ovarian syndrome: Secondary | ICD-10-CM | POA: Diagnosis not present

## 2023-02-02 DIAGNOSIS — R051 Acute cough: Secondary | ICD-10-CM

## 2023-02-02 DIAGNOSIS — F411 Generalized anxiety disorder: Secondary | ICD-10-CM

## 2023-02-02 DIAGNOSIS — E669 Obesity, unspecified: Secondary | ICD-10-CM

## 2023-02-02 MED ORDER — WEGOVY 0.25 MG/0.5ML ~~LOC~~ SOAJ
0.2500 mg | SUBCUTANEOUS | 1 refills | Status: DC
Start: 2023-02-02 — End: 2023-07-16

## 2023-02-02 MED ORDER — BENZONATATE 100 MG PO CAPS: 100.0000 mg | ORAL_CAPSULE | Freq: Three times a day (TID) | ORAL | 0 refills | Status: DC | PRN

## 2023-02-02 NOTE — Progress Notes (Signed)
Patient ID: Misty Sanchez, female    DOB: December 27, 1980, 43 y.o.   MRN: [id number]   Assessment & Plan:  Insulin resistance -     Wegovy; Inject 0.25 mg into the skin once a week.  Dispense: 2 mL; Refill: 1  Obesity (BMI 30-39.9) -     QIONGE; Inject 0.25 mg into the skin once a week.  Dispense: 2 mL; Refill: 1  PCOS (polycystic ovarian syndrome) -     XBMWUX; Inject 0.25 mg into the skin once a week.  Dispense: 2 mL; Refill: 1  Acute cough  GAD (generalized anxiety disorder)  Other orders -     Benzonatate; Take 1 capsule (100 mg total) by mouth 3 (three) times daily as needed for cough.  Dispense: 20 capsule; Refill: 0   Assessment and Plan    Upper Respiratory Infection Persistent cough and phlegm production for three weeks, initially with severe congestion and now improved. No signs of infection on examination. -Prescribe Tessalon Perles for symptomatic relief of cough.  Weight Management Weight increase noted, likely due to cessation of exercise secondary to illness and environmental factors. Previous success with LKGMWN for weight management, but had to discontinue due to shortage. Recently went through 3 months of Phentermine and had successful loss of 12 lbs, with a few lbs regained recently. Cannot stay on phentermine long-term. Would like to restart [facility] for continued weight loss efforts.  -Submit prescription for [facility] to [facility] pharmacy and await insurance approval. -Schedule follow-up in three months to evaluate response to [facility].  Depression/Anxiety Elevated stress due to recent financial scam and ongoing weight management issues. Currently on Prozac, which remains effective. -Continue Prozac as prescribed. -Encourage continued stress management strategies.  General Health Maintenance -Encourage resumption of regular exercise as health improves.           Return in about 3 months (around 05/03/2023) for recheck/follow-up.    Subjective:     Chief Complaint  Patient presents with   Medical Management of Chronic Issues    Pt in office for 3 mon f/u with insulin resistance;  pt is in last stages of getting over a cold for the past 3 weeks, still a tickle cough and phlegm in throat first thing in the mornings;      HPI Discussed the use of AI scribe software for clinical note transcription with the patient, who gave verbal consent to proceed.  History of Present Illness   The patient, with a history of Polycystic Ovary Syndrome (PCOS) and weight management issues, presents with a persistent cough and phlegm production for about three weeks. The cough is triggered by a tickling sensation in the throat. The symptoms started around [place] with severe congestion in the head, a scratchy throat, and constant phlegm production. The patient reports that the symptoms have improved, with the phlegm production now mostly occurring in the morning. The patient also reports a recent scam incident that led to a significant financial loss, causing increased stress and anxiety. The patient's weight has started to increase again as the current weight management medication seems to be less effective. The patient also has tubes in the ears due to frequent ear infections during flights.       Past Medical History:  Diagnosis Date   ADHD (attention deficit hyperactivity disorder)    Anxiety    Frequent UTI    Hyperlipidemia    IC (interstitial cystitis)    Pain in left foot 11/21/2021  Pain in right foot 11/23/2021   PCOS (polycystic ovarian syndrome)     Past Surgical History:  Procedure Laterality Date   TYMPANOSTOMY TUBE PLACEMENT Bilateral 03/2013   WISDOM TOOTH EXTRACTION      Family History  Problem Relation Age of Onset   Hypertension Mother    Celiac disease Father    Diabetes Paternal Uncle    Drug abuse Paternal Uncle        heroin   Cancer Maternal Grandmother        gland behind the ear    Social History    Tobacco Use   Smoking status: Never   Smokeless tobacco: Never  Vaping Use   Vaping status: Never Used  Substance Use Topics   Alcohol use: No   Drug use: No     Allergies  Allergen Reactions   Azithromycin Other (See Comments)    Nausea ( z-pack )    Review of Systems NEGATIVE UNLESS OTHERWISE INDICATED IN HPI      Objective:     BP 128/86 (BP Location: Left Arm, Patient Position: Sitting)   Pulse (!) 104   Temp (!) 97.2 F (36.2 C) (Temporal)   Ht 5\' 7"  (1.702 m)   Wt 193 lb (87.5 kg)   SpO2 98%   BMI 30.23 kg/m   Wt Readings from Last 3 Encounters:  02/02/23 193 lb (87.5 kg)  10/20/22 198 lb 8 oz (90 kg)  07/29/22 200 lb 3.2 oz (90.8 kg)    BP Readings from Last 3 Encounters:  02/02/23 128/86  10/20/22 116/80  07/29/22 99/65     Physical Exam Vitals and nursing note reviewed.  Constitutional:      General: She is not in acute distress.    Appearance: Normal appearance. She is not ill-appearing.  HENT:     Head: Normocephalic.     Right Ear: Ear canal and external ear normal. A PE tube is present.     Left Ear: Ear canal and external ear normal. A PE tube is present.     Nose: Congestion present.     Mouth/Throat:     Mouth: Mucous membranes are moist.     Pharynx: No oropharyngeal exudate or posterior oropharyngeal erythema.  Eyes:     Extraocular Movements: Extraocular movements intact.     Conjunctiva/sclera: Conjunctivae normal.     Pupils: Pupils are equal, round, and reactive to light.  Cardiovascular:     Rate and Rhythm: Normal rate and regular rhythm.     Pulses: Normal pulses.     Heart sounds: Normal heart sounds. No murmur heard. Pulmonary:     Effort: Pulmonary effort is normal. No respiratory distress.     Breath sounds: Normal breath sounds. No wheezing.     Comments: Some dry coughing during exam  Musculoskeletal:     Cervical back: Normal range of motion.  Skin:    General: Skin is warm.  Neurological:     Mental  Status: She is alert and oriented to person, place, and time.  Psychiatric:        Mood and Affect: Mood normal.        Behavior: Behavior normal.         Misty Sanchez M Misty Lundeen, PA-C

## 2023-02-03 ENCOUNTER — Other Ambulatory Visit: Payer: Self-pay

## 2023-02-03 ENCOUNTER — Encounter: Payer: Self-pay | Admitting: Physician Assistant

## 2023-02-03 DIAGNOSIS — E88819 Insulin resistance, unspecified: Secondary | ICD-10-CM

## 2023-02-03 DIAGNOSIS — R635 Abnormal weight gain: Secondary | ICD-10-CM

## 2023-02-03 DIAGNOSIS — E282 Polycystic ovarian syndrome: Secondary | ICD-10-CM

## 2023-02-03 DIAGNOSIS — E669 Obesity, unspecified: Secondary | ICD-10-CM

## 2023-02-03 NOTE — Telephone Encounter (Signed)
Please see patient message and advise if wanting to send an alternative

## 2023-02-03 NOTE — Telephone Encounter (Signed)
Please see patient response and advise if you would like me to place referral to [facility]

## 2023-02-19 NOTE — Telephone Encounter (Signed)
 Please see patient message and advise on next steps and refill for Rx

## 2023-02-19 NOTE — Telephone Encounter (Signed)
FYI from patient

## 2023-03-18 ENCOUNTER — Encounter (INDEPENDENT_AMBULATORY_CARE_PROVIDER_SITE_OTHER): Payer: Self-pay

## 2023-04-07 DIAGNOSIS — H6993 Unspecified Eustachian tube disorder, bilateral: Secondary | ICD-10-CM | POA: Insufficient documentation

## 2023-04-16 ENCOUNTER — Telehealth: Payer: Self-pay

## 2023-04-16 NOTE — Telephone Encounter (Signed)
 Called pt to schedule follow up appt for April per last OV note, pt in meeting and asked to cb after 2pm. If patient calls back please schedule f/u appt. Pt also due for annual CPE that needs to be scheduled.

## 2023-05-04 ENCOUNTER — Ambulatory Visit: Payer: 59 | Admitting: Physician Assistant

## 2023-07-13 ENCOUNTER — Encounter: Payer: Self-pay | Admitting: Physician Assistant

## 2023-07-16 ENCOUNTER — Encounter: Payer: Self-pay | Admitting: Physician Assistant

## 2023-07-16 ENCOUNTER — Telehealth: Admitting: Physician Assistant

## 2023-07-16 VITALS — Ht 67.0 in | Wt 193.0 lb

## 2023-07-16 DIAGNOSIS — R635 Abnormal weight gain: Secondary | ICD-10-CM

## 2023-07-16 DIAGNOSIS — F419 Anxiety disorder, unspecified: Secondary | ICD-10-CM

## 2023-07-16 DIAGNOSIS — F902 Attention-deficit hyperactivity disorder, combined type: Secondary | ICD-10-CM | POA: Diagnosis not present

## 2023-07-16 MED ORDER — METHYLPHENIDATE HCL ER (CD) 10 MG PO CPCR
10.0000 mg | ORAL_CAPSULE | ORAL | 0 refills | Status: DC
Start: 2023-07-16 — End: 2023-08-10

## 2023-07-16 MED ORDER — PROPRANOLOL HCL 20 MG PO TABS: ORAL_TABLET | ORAL | 2 refills | Status: DC

## 2023-07-16 NOTE — Progress Notes (Signed)
 Virtual Visit via Video Note  I connected with  Misty Sanchez  on 07/16/23 at 10:00 AM EDT by a video enabled telemedicine application and verified that I am speaking with the correct person using two identifiers.  Location: Patient: work, [place], private office Provider: Nature conservation officer at [employer] Persons present: Patient and myself   I discussed the limitations of evaluation and management by telemedicine and the availability of in person appointments. The patient expressed understanding and agreed to proceed.   History of Present Illness:  Discussed the use of AI scribe software for clinical note transcription with the patient, who gave verbal consent to proceed.  History of Present Illness Misty Sanchez is a 43 year old female with ADHD who presents with difficulty concentrating and anxiety related to plasma donation.  She experiences significant difficulty concentrating, particularly after work hours, leading to stress and anxiety. She is easily distracted and often loses track of time while engaging in activities such as scrolling through social media. This results in mental stress and running out of time to complete planned tasks, subsequently affecting her sleep schedule as she goes to bed late.  She has a history of ADHD, diagnosed in kindergarten, and was treated throughout her schooling years until college, where she weaned off medication. It has been at least ten years since she last took medication for ADHD. She recalls taking a short-acting medication as a child and a longer-acting one in high school, both of which were effective.  She experiences anxiety related to plasma donation, which requires her pulse to be below 100 beats per minute. She has been using propranolol  10 mg as needed before donations, but it is no longer effective. She has had instances where her anxiety caused her pulse to rise, necessitating multiple attempts to donate plasma.  She donates plasma twice a week.  She is currently taking phentermine  for weight management, prescribed by a bariatric clinic, but is willing to discontinue it to manage her ADHD symptoms more effectively. She also takes semaglutide  for weight loss. She reports losing inches around her waist and feeling positive about her progress, although she notes stress and anxiety may be impacting her sleep and weight loss efforts.   Observations/Objective:   Gen: Awake, alert, no acute distress Resp: Breathing is even and non-labored Psych: calm/pleasant demeanor Neuro: Alert and Oriented x 3, + facial symmetry, speech is clear.   Assessment and Plan:  Assessment and Plan Assessment & Plan Pre-procedural anxiety Misty Sanchez experiences anxiety related to plasma donation, causing her pulse to exceed the acceptable limit. Previously managed with propranolol  10 mg, but this dose is no longer effective. Increasing the dose to 20 mg is expected to better manage her anxiety and allow her to meet the pulse requirements for plasma donation. - Prescribe propranolol  20 mg to be taken 30-60 minutes prior to plasma donation to manage anxiety.  Attention-Deficit/Hyperactivity Disorder (ADHD) Diagnosed with ADHD since kindergarten, Sanchez has been off medication for approximately ten years. She reports difficulty concentrating and completing tasks, particularly in the afternoon and at home, leading to stress and anxiety. Previously treated with Metadate and another medication during her school years, both of which were effective. Currently taking phentermine  for weight loss, which cannot be combined with ADHD stimulants. Metadate CD 10 mg is chosen for its intermediate release, providing approximately eight hours of symptom control, which aligns with her need for afternoon focus. - Prescribe Metadate CD 10 mg to be taken around  10 or 11 AM to help with afternoon focus. - Instruct Misty Sanchez to discontinue phentermine  to avoid  interaction with Metadate. - Schedule follow-up appointment in 3-4 weeks to assess the effectiveness of Metadate and adjust dosage if necessary. - Discuss the need for a medication contract and quarterly follow-ups for ADHD medication management.  Weight management Sanchez is on a weight management program, taking semaglutide  and phentermine . She reports losing inches but not as much weight as desired, possibly due to stress and anxiety affecting her sleep. Discontinuing phentermine  is necessary due to potential interaction with ADHD medication. - Continue semaglutide  for weight management. - Discontinue phentermine  due to potential interaction with ADHD medication. - Continue to f/up with specialist    Follow Up Instructions:    I discussed the assessment and treatment plan with the patient. The patient was provided an opportunity to ask questions and all were answered. The patient agreed with the plan and demonstrated an understanding of the instructions.   The patient was advised to call back or seek an in-person evaluation if the symptoms worsen or if the condition fails to improve as anticipated.  Shonia Skilling M Nyonna Hargrove, PA-C

## 2023-07-20 ENCOUNTER — Other Ambulatory Visit (HOSPITAL_COMMUNITY): Payer: Self-pay

## 2023-07-20 ENCOUNTER — Telehealth: Payer: Self-pay

## 2023-07-20 NOTE — Telephone Encounter (Signed)
 Pharmacy Patient Advocate Encounter   Received notification from Onbase that prior authorization for Methylphenidate  HCl ER (CD) 10MG  er capsules is required/requested.   Insurance verification completed.   The patient is insured through [facility] .   Per test claim: PA required; PA submitted to above mentioned insurance via CoverMyMeds Key/confirmation #/EOC [id number] Status is pending

## 2023-07-21 NOTE — Telephone Encounter (Signed)
 Noted

## 2023-07-21 NOTE — Telephone Encounter (Signed)
 Pharmacy Patient Advocate Encounter  Received notification from [facility] that Prior Authorization for Methylphenidate  HCl ER (CD) 10MG  er capsules has been APPROVED from 07/20/23 to 07/20/26   PA #/Case ID/Reference #: [id number]

## 2023-08-10 ENCOUNTER — Other Ambulatory Visit (HOSPITAL_COMMUNITY): Payer: Self-pay

## 2023-08-10 ENCOUNTER — Encounter: Payer: Self-pay | Admitting: Physician Assistant

## 2023-08-10 ENCOUNTER — Telehealth: Payer: Self-pay

## 2023-08-10 ENCOUNTER — Telehealth (INDEPENDENT_AMBULATORY_CARE_PROVIDER_SITE_OTHER): Admitting: Physician Assistant

## 2023-08-10 VITALS — Ht 67.0 in | Wt 184.0 lb

## 2023-08-10 DIAGNOSIS — F902 Attention-deficit hyperactivity disorder, combined type: Secondary | ICD-10-CM

## 2023-08-10 DIAGNOSIS — F419 Anxiety disorder, unspecified: Secondary | ICD-10-CM

## 2023-08-10 DIAGNOSIS — M62838 Other muscle spasm: Secondary | ICD-10-CM

## 2023-08-10 MED ORDER — METHYLPHENIDATE HCL ER (CD) 20 MG PO CPCR: 20.0000 mg | ORAL_CAPSULE | ORAL | 0 refills | Status: DC

## 2023-08-10 NOTE — Progress Notes (Signed)
 Virtual Visit via Video Note  I connected with  Misty Sanchez  on 08/10/23 at  8:30 AM EDT by a video enabled telemedicine application and verified that I am speaking with the correct person using two identifiers.  Location: Patient: work in [employer] Provider: Nature conservation officer at [employer] Persons present: Patient and myself   I discussed the limitations of evaluation and management by telemedicine and the availability of in person appointments. The patient expressed understanding and agreed to proceed.   History of Present Illness:  Discussed the use of AI scribe software for clinical note transcription with the patient, who gave verbal consent to proceed.  History of Present Illness Misty Sanchez is a 43 year old female who presents for medication management for anxiety and ADHD.  She has been using propranolol  10 mg as needed for anxiety, particularly before donating plasma. She has not yet started the higher dose of propranolol  as she was finishing the remaining pills of the lower dose. She reported that when she doubled up on the old propranolol  tablets, it helped.  She is currently taking methylphenidate  CD 10 mg for ADHD but does not feel it is making a significant difference in her ability to accomplish tasks or get work done. She was off this medication for about fifteen years and is unsure if she recognizes its effects. She takes the medication during workdays around 10 or 11 in the morning and does not use it on weekends.  She has trouble getting enough sleep, often staying up late and getting distracted by videos on her phone. She describes this as getting 'sucked down the rabbit hole,' which she attributes to her ADHD. This has been a problem since May.  She rarely uses Flexeril  for muscle spasms or headaches, as she has been experiencing fewer issues recently. However, when she did use it in the spring, she found it made her groggy in the mornings,  affecting her ability to function and drive to work.     Observations/Objective:   Gen: Awake, alert, no acute distress Resp: Breathing is even and non-labored Psych: calm/pleasant demeanor Neuro: Alert and Oriented x 3, + facial symmetry, speech is clear.   Assessment and Plan:  Assessment and Plan Assessment & Plan Attention-Deficit/Hyperactivity Disorder (ADHD) She experiences difficulty with focus and task completion, likely related to ADHD. Methylphenidate  CD 10 mg has been ineffective, possibly due to an insufficient dose. Increasing to 20 mg may improve symptom control. - Increase methylphenidate  CD to 20 mg daily. - Advise taking two 10 mg tablets to achieve 20 mg dose until new prescription is filled. - Schedule follow-up in 8 weeks to reassess medication efficacy. - PDMP reviewed today, no red flags, filling appropriately.    Anxiety She experiences anxiety, particularly in situations like plasma donation. Propranolol  10 mg was insufficient, but 20 mg has been more effective. She is advised to follow medical guidance to avoid adverse effects such as stomach ulcers. - Continue propranolol  20 mg as needed for anxiety. - Monitor effectiveness and report any issues.  Sleep Disturbance She reports difficulty sleeping, exacerbated by late-night social media and electronic device use. ADHD impacts her sleep patterns. Limiting screen time before bed may help. - Advise setting phone restrictions to limit nighttime social media use.  Muscle Spasms She experiences muscle spasms, using Flexeril  occasionally, but reports grogginess affecting work. Advised to use sparingly due to side effects. - Use Flexeril  sparingly and only when necessary.  Follow Up Instructions:    I discussed the assessment and treatment plan with the patient. The patient was provided an opportunity to ask questions and all were answered. The patient agreed with the plan and demonstrated an  understanding of the instructions.   The patient was advised to call back or seek an in-person evaluation if the symptoms worsen or if the condition fails to improve as anticipated.  Niraj Kudrna M Khyra Viscuso, PA-C

## 2023-08-10 NOTE — Telephone Encounter (Signed)
 Pharmacy Patient Advocate Encounter   Received notification from CoverMyMeds that prior authorization for Methylphenidate  HCl ER (CD) 20MG  er capsules is required/requested.   Insurance verification completed.   The patient is insured through [facility] .   Per test claim: PA required; PA started via CoverMyMeds. KEY [id number] . Waiting for clinical questions to populate.

## 2023-08-11 ENCOUNTER — Other Ambulatory Visit (HOSPITAL_COMMUNITY): Payer: Self-pay

## 2023-08-11 NOTE — Telephone Encounter (Signed)
 Pharmacy Patient Advocate Encounter  Received notification from [facility] that Prior Authorization for Methylphenidate  HCl ER (CD) 20MG  er capsules  has been APPROVED from 08/11/23 to 08/12/23. Ran test claim, Copay is $5. This test claim was processed through [facility]- copay amounts may vary at other pharmacies due to pharmacy/plan contracts, or as the patient moves through the different stages of their insurance plan.   PA #/Case ID/Reference #: [id number]

## 2023-08-11 NOTE — Telephone Encounter (Signed)
 PLEASE BE ADVISED Clinical questions have been answered and PA submitted.TO PLAN. PA currently Pending.

## 2023-09-04 ENCOUNTER — Other Ambulatory Visit: Payer: Self-pay | Admitting: Physician Assistant

## 2023-09-04 DIAGNOSIS — F411 Generalized anxiety disorder: Secondary | ICD-10-CM

## 2023-09-29 ENCOUNTER — Telehealth: Payer: Self-pay

## 2023-09-29 NOTE — Telephone Encounter (Signed)
 Called pt regarding over due Mammo and scheduling via mobile mammo or GBC, pt states upcoming appt with GYN next month and they will be getting this taken care of for her. Unable to add GYN Care teams due to patient not sure of name of the provider or office. Attempted to schedule 8 wk follow up per last OV note with PCP and pt states was advised in her appt to send MyChart msg to PCP with update and request for refill, no appt needed at this time.

## 2023-10-08 ENCOUNTER — Encounter: Payer: Self-pay | Admitting: Physician Assistant

## 2023-10-08 NOTE — Telephone Encounter (Signed)
 Please see pt msg

## 2023-10-09 ENCOUNTER — Other Ambulatory Visit: Payer: Self-pay | Admitting: Physician Assistant

## 2023-10-09 MED ORDER — METHYLPHENIDATE HCL ER (CD) 20 MG PO CPCR: 20.0000 mg | ORAL_CAPSULE | ORAL | 0 refills | Status: DC

## 2023-10-12 ENCOUNTER — Telehealth: Payer: Self-pay

## 2023-10-12 ENCOUNTER — Other Ambulatory Visit (HOSPITAL_COMMUNITY): Payer: Self-pay

## 2023-10-12 NOTE — Telephone Encounter (Signed)
 Pharmacy Patient Advocate Encounter  Received notification from [facility] that Prior Authorization for Methylphenidate  HCl ER (CD) 20MG  er capsules  has been APPROVED from 10/12/23 to 10/12/26. Ran test claim, Copay is $5. This test claim was processed through [facility]- copay amounts may vary at other pharmacies due to pharmacy/plan contracts, or as the patient moves through the different stages of their insurance plan.   PA #/Case ID/Reference #: [id number]  *spoke with [place] Prior to process

## 2023-10-12 NOTE — Telephone Encounter (Signed)
 Pharmacy Patient Advocate Encounter   Received notification from Onbase that prior authorization for Methylphenidate  HCl ER (CD) 20MG  er capsules  is required/requested.   Insurance verification completed.   The patient is insured through [facility] .   Per test claim: PA required; PA submitted to above mentioned insurance via Latent Key/confirmation #/[id number] [facility] Status is pending

## 2023-10-12 NOTE — Telephone Encounter (Signed)
 Patient PA has expired can we please get PA completed for patient today. Thanks

## 2023-11-25 ENCOUNTER — Encounter: Payer: Self-pay | Admitting: Physician Assistant

## 2023-11-25 NOTE — Telephone Encounter (Signed)
 Pt last had telemed visit in July; please advise if pt needs OV for refill

## 2023-11-26 NOTE — Telephone Encounter (Signed)
 Please contact patient and schedule OV with PCP for refills

## 2023-11-27 ENCOUNTER — Encounter: Payer: Self-pay | Admitting: Physician Assistant

## 2023-11-27 ENCOUNTER — Telehealth: Admitting: Physician Assistant

## 2023-11-27 VITALS — Ht 67.0 in | Wt 184.0 lb

## 2023-11-27 DIAGNOSIS — F902 Attention-deficit hyperactivity disorder, combined type: Secondary | ICD-10-CM | POA: Diagnosis not present

## 2023-11-27 DIAGNOSIS — E663 Overweight: Secondary | ICD-10-CM

## 2023-11-27 DIAGNOSIS — M533 Sacrococcygeal disorders, not elsewhere classified: Secondary | ICD-10-CM

## 2023-11-27 DIAGNOSIS — E785 Hyperlipidemia, unspecified: Secondary | ICD-10-CM | POA: Insufficient documentation

## 2023-11-27 DIAGNOSIS — Z6828 Body mass index (BMI) 28.0-28.9, adult: Secondary | ICD-10-CM | POA: Diagnosis not present

## 2023-11-27 DIAGNOSIS — G44209 Tension-type headache, unspecified, not intractable: Secondary | ICD-10-CM | POA: Insufficient documentation

## 2023-11-27 DIAGNOSIS — N3010 Interstitial cystitis (chronic) without hematuria: Secondary | ICD-10-CM | POA: Insufficient documentation

## 2023-11-27 MED ORDER — METHYLPHENIDATE HCL ER (CD) 20 MG PO CPCR: 20.0000 mg | ORAL_CAPSULE | ORAL | 0 refills | Status: AC

## 2023-11-27 NOTE — Progress Notes (Signed)
   Virtual Visit via Video Note  I connected with  Misty Sanchez  on 11/27/23 at  2:00 PM EST by a video enabled telemedicine application and verified that I am speaking with the correct person using two identifiers.  Location: Patient: home Provider: [employer] Persons present: Patient and myself   I discussed the limitations of evaluation and management by telemedicine and the availability of in person appointments. The patient expressed understanding and agreed to proceed.   History of Present Illness:  Discussed the use of AI scribe software for clinical note transcription with the patient, who gave verbal consent to proceed.  History of Present Illness Misty Sanchez is a 43 year old female who presents for a medication follow-up for ADHD.  She is currently taking Metadate  20 mg daily on weekdays, which helps her focus and concentrate well. No adverse effects such as chest pain or nausea are noted.  She is attending a bariatric clinic for weight control and has been on a new regimen for two weeks, which she is tolerating well. She feels slightly more hungry than before but is able to control it.  She reports significant back pain and states that [facility] determined there were problems with her SI joint two weeks ago. She completed a six-day course of prednisone  and is scheduled to start physical therapy on December 1st. She reports that x-rays showed a small degree of scoliosis, but she was told this was not the main problem; she was told there was inflammation in her SI joint causing discomfort across her waist.  She is scheduled for a physical examination with her OBGYN in two weeks, which will include her first mammogram. There is no family history of breast cancer.     Observations/Objective:   Gen: Awake, alert, no acute distress Resp: Breathing is even and non-labored Psych: calm/pleasant demeanor Neuro: Alert and Oriented  x 3, + facial symmetry, speech is clear.   Assessment and Plan:  Assessment & Plan Attention-deficit hyperactivity disorder, combined type ADHD is well-managed with Metadate  20 mg daily on weekdays. She reports improved focus and concentration without adverse effects such as chest pain or sickness. - Refilled Metadate  20 mg for three months, to be taken five days a week. PDMP reviewed today, no red flags, filling appropriately.   Sacroiliac joint disorder She experienced debilitating back pain due to SI joint issues, treated with a six-day course of prednisone . X-rays showed inflammation in the SI joint and a small degree of scoliosis, though scoliosis is not considered the primary issue. Physical therapy is scheduled to start on December 1st. - Continue with scheduled physical therapy starting December 1st.  Obesity She is under the care of a bariatric clinic and is receiving HCG once a week for weight control. She reports feeling slightly more hungry than on previous medication but is managing well. - Continue CG once a week for weight control.    Follow Up Instructions:    I discussed the assessment and treatment plan with the patient. The patient was provided an opportunity to ask questions and all were answered. The patient agreed with the plan and demonstrated an understanding of the instructions.   The patient was advised to call back or seek an in-person evaluation if the symptoms worsen or if the condition fails to improve as anticipated.  Elizah Mierzwa M Janica Eldred, PA-C

## 2023-12-15 ENCOUNTER — Other Ambulatory Visit: Payer: Self-pay | Admitting: Obstetrics & Gynecology

## 2023-12-15 DIAGNOSIS — Z09 Encounter for follow-up examination after completed treatment for conditions other than malignant neoplasm: Secondary | ICD-10-CM

## 2023-12-15 DIAGNOSIS — R928 Other abnormal and inconclusive findings on diagnostic imaging of breast: Secondary | ICD-10-CM

## 2023-12-16 ENCOUNTER — Encounter: Payer: Self-pay | Admitting: Obstetrics & Gynecology

## 2023-12-29 ENCOUNTER — Other Ambulatory Visit: Payer: Self-pay | Admitting: Obstetrics & Gynecology

## 2023-12-29 ENCOUNTER — Inpatient Hospital Stay
Admission: RE | Admit: 2023-12-29 | Discharge: 2023-12-29 | Attending: Obstetrics & Gynecology | Admitting: Obstetrics & Gynecology

## 2023-12-29 ENCOUNTER — Other Ambulatory Visit: Payer: Self-pay | Admitting: Physician Assistant

## 2023-12-29 ENCOUNTER — Ambulatory Visit
Admission: RE | Admit: 2023-12-29 | Discharge: 2023-12-29 | Disposition: A | Source: Ambulatory Visit | Attending: Obstetrics & Gynecology | Admitting: Obstetrics & Gynecology

## 2023-12-29 DIAGNOSIS — N6321 Unspecified lump in the left breast, upper outer quadrant: Secondary | ICD-10-CM

## 2023-12-29 DIAGNOSIS — R928 Other abnormal and inconclusive findings on diagnostic imaging of breast: Secondary | ICD-10-CM

## 2023-12-29 DIAGNOSIS — F419 Anxiety disorder, unspecified: Secondary | ICD-10-CM

## 2024-01-05 ENCOUNTER — Encounter: Payer: Self-pay | Admitting: Physician Assistant

## 2024-01-05 ENCOUNTER — Ambulatory Visit
Admission: RE | Admit: 2024-01-05 | Discharge: 2024-01-05 | Disposition: A | Discharge status: Home / Self Care | Source: Ambulatory Visit | Attending: Obstetrics & Gynecology | Admitting: Obstetrics & Gynecology

## 2024-01-05 DIAGNOSIS — R928 Other abnormal and inconclusive findings on diagnostic imaging of breast: Secondary | ICD-10-CM

## 2024-01-05 DIAGNOSIS — N6321 Unspecified lump in the left breast, upper outer quadrant: Secondary | ICD-10-CM

## 2024-01-05 HISTORY — PX: BREAST BIOPSY: SHX20

## 2024-01-06 LAB — SURGICAL PATHOLOGY
